# Patient Record
Sex: Male | Born: 1940 | Race: White | Hispanic: No | Marital: Married | Smoking: Former smoker
Health system: Southern US, Community
[De-identification: ages and names within clinical notes are randomized; demographics above are authoritative.]

## PROBLEM LIST (undated history)

## (undated) DIAGNOSIS — M1990 Unspecified osteoarthritis, unspecified site: Secondary | ICD-10-CM

## (undated) DIAGNOSIS — E785 Hyperlipidemia, unspecified: Secondary | ICD-10-CM

## (undated) DIAGNOSIS — N189 Chronic kidney disease, unspecified: Secondary | ICD-10-CM

## (undated) DIAGNOSIS — K219 Gastro-esophageal reflux disease without esophagitis: Secondary | ICD-10-CM

## (undated) DIAGNOSIS — I10 Essential (primary) hypertension: Secondary | ICD-10-CM

## (undated) DIAGNOSIS — K5790 Diverticulosis of intestine, part unspecified, without perforation or abscess without bleeding: Secondary | ICD-10-CM

## (undated) DIAGNOSIS — E119 Type 2 diabetes mellitus without complications: Secondary | ICD-10-CM

## (undated) HISTORY — PX: TONSILLECTOMY: SUR1361

---

## 1981-02-27 HISTORY — PX: KIDNEY STONE SURGERY: SHX686

## 2013-06-06 ENCOUNTER — Ambulatory Visit: Payer: Self-pay | Admitting: Family Medicine

## 2015-03-04 ENCOUNTER — Encounter
Admission: RE | Admit: 2015-03-04 | Discharge: 2015-03-04 | Disposition: A | Discharge status: Home / Self Care | Payer: Medicare Other | Source: Ambulatory Visit | Attending: Surgery | Admitting: Surgery

## 2015-03-04 DIAGNOSIS — Z01810 Encounter for preprocedural cardiovascular examination: Secondary | ICD-10-CM | POA: Insufficient documentation

## 2015-03-04 HISTORY — DX: Chronic kidney disease, unspecified: N18.9

## 2015-03-04 HISTORY — DX: Diverticulosis of intestine, part unspecified, without perforation or abscess without bleeding: K57.90

## 2015-03-04 HISTORY — DX: Hyperlipidemia, unspecified: E78.5

## 2015-03-04 HISTORY — DX: Gastro-esophageal reflux disease without esophagitis: K21.9

## 2015-03-04 HISTORY — DX: Unspecified osteoarthritis, unspecified site: M19.90

## 2015-03-04 NOTE — Patient Instructions (Signed)
  Your procedure is scheduled on:March 11, 2015 (Thursday) Report to Day Surgery.[facility](Medical Mall) Second Floor To find out your arrival time please call [phone] between 1PM - 3PM on March 10, 2015 (Wednesday).  Remember: Instructions that are not followed completely may result in serious medical risk, up to and including death, or upon the discretion of your surgeon and anesthesiologist your surgery may need to be rescheduled.    __x__ 1. Do not eat food or drink liquids after midnight. No gum chewing or hard candies.     __x__ 2. No Alcohol for 24 hours before or after surgery.   ____ 3. Bring all medications with you on the day of surgery if instructed.    __x__ 4. Notify your doctor if there is any change in your medical condition     (cold, fever, infections).     Do not wear jewelry, make-up, hairpins, clips or nail polish.  Do not wear lotions, powders, or perfumes. You may wear deodorant.  Do not shave 48 hours prior to surgery. Men may shave face and neck.  Do not bring valuables to the hospital.    [facility] is not responsible for any belongings or valuables.               Contacts, dentures or bridgework may not be worn into surgery.  Leave your suitcase in the car. After surgery it may be brought to your room.  For patients admitted to the hospital, discharge time is determined by your                treatment team.   Patients discharged the day of surgery will not be allowed to drive home.   Please read over the following fact sheets that you were given:   Surgical Site Infection Prevention   __x__ Take these medicines the morning of surgery with A SIP OF WATER:    1. Omeprazole  2.   3.   4.  5.  6.  ____ Fleet Enema (as directed)   ____ Use CHG Soap as directed  ____ Use inhalers on the day of surgery  ____ Stop metformin 2 days prior to surgery    ____ Take 1/2 of usual insulin dose the night before surgery and none on the morning of surgery.    __x__ Stop Coumadin/Plavix/aspirin on (Patient has stopped Aspirin)  __x__ Stop Anti-inflammatories on (Tylenol ok to take for pain if needed)   __x__ Stop supplements until after surgery.  (Stop Fish Oil and Melatonin now)  ____ Bring C-Pap to the hospital.

## 2015-03-11 ENCOUNTER — Encounter
Admission: RE | Disposition: A | Discharge status: Home / Self Care | Payer: Self-pay | Source: Ambulatory Visit | Attending: Surgery

## 2015-03-11 ENCOUNTER — Encounter: Payer: Self-pay | Admitting: Surgery

## 2015-03-11 ENCOUNTER — Ambulatory Visit
Admission: RE | Admit: 2015-03-11 | Discharge: 2015-03-11 | Disposition: A | Discharge status: Home / Self Care | Payer: Medicare Other | Source: Ambulatory Visit | Attending: Surgery | Admitting: Surgery

## 2015-03-11 ENCOUNTER — Ambulatory Visit: Payer: Medicare Other | Admitting: Anesthesiology

## 2015-03-11 DIAGNOSIS — K429 Umbilical hernia without obstruction or gangrene: Secondary | ICD-10-CM | POA: Diagnosis present

## 2015-03-11 DIAGNOSIS — E785 Hyperlipidemia, unspecified: Secondary | ICD-10-CM | POA: Diagnosis not present

## 2015-03-11 DIAGNOSIS — Z87891 Personal history of nicotine dependence: Secondary | ICD-10-CM | POA: Diagnosis not present

## 2015-03-11 DIAGNOSIS — K219 Gastro-esophageal reflux disease without esophagitis: Secondary | ICD-10-CM | POA: Insufficient documentation

## 2015-03-11 DIAGNOSIS — Z7982 Long term (current) use of aspirin: Secondary | ICD-10-CM | POA: Insufficient documentation

## 2015-03-11 DIAGNOSIS — Z87442 Personal history of urinary calculi: Secondary | ICD-10-CM | POA: Insufficient documentation

## 2015-03-11 DIAGNOSIS — M1990 Unspecified osteoarthritis, unspecified site: Secondary | ICD-10-CM | POA: Insufficient documentation

## 2015-03-11 DIAGNOSIS — Z79899 Other long term (current) drug therapy: Secondary | ICD-10-CM | POA: Insufficient documentation

## 2015-03-11 DIAGNOSIS — K5790 Diverticulosis of intestine, part unspecified, without perforation or abscess without bleeding: Secondary | ICD-10-CM | POA: Insufficient documentation

## 2015-03-11 HISTORY — PX: UMBILICAL HERNIA REPAIR: SHX196

## 2015-03-11 SURGERY — REPAIR, HERNIA, UMBILICAL, ADULT
Anesthesia: Choice | Wound class: Clean

## 2015-03-11 MED ORDER — FENTANYL CITRATE (PF) 100 MCG/2ML IJ SOLN
INTRAMUSCULAR | Status: DC | PRN
  Administered 2015-03-11: 50 ug via INTRAVENOUS
  Administered 2015-03-11 (×2): 25 ug via INTRAVENOUS

## 2015-03-11 MED ORDER — FENTANYL CITRATE (PF) 100 MCG/2ML IJ SOLN
INTRAMUSCULAR | Status: AC
  Administered 2015-03-11: 25 ug via INTRAVENOUS
  Filled 2015-03-11: qty 2

## 2015-03-11 MED ORDER — HYDROCODONE-ACETAMINOPHEN 5-325 MG PO TABS: 1.0000 | ORAL_TABLET | ORAL | Status: AC | PRN

## 2015-03-11 MED ORDER — LIDOCAINE HCL (CARDIAC) 20 MG/ML IV SOLN
INTRAVENOUS | Status: DC | PRN
  Administered 2015-03-11: 40 mg via INTRAVENOUS

## 2015-03-11 MED ORDER — ONDANSETRON HCL 4 MG/2ML IJ SOLN: 4.0000 mg | Freq: Once | INTRAMUSCULAR | Status: DC | PRN

## 2015-03-11 MED ORDER — CEFAZOLIN SODIUM-DEXTROSE 2-3 GM-% IV SOLR
INTRAVENOUS | Status: AC
  Filled 2015-03-11: qty 50

## 2015-03-11 MED ORDER — PROPOFOL 10 MG/ML IV BOLUS
INTRAVENOUS | Status: DC | PRN
  Administered 2015-03-11: 160 mg via INTRAVENOUS

## 2015-03-11 MED ORDER — BUPIVACAINE-EPINEPHRINE (PF) 0.5% -1:200000 IJ SOLN
INTRAMUSCULAR | Status: DC | PRN
  Administered 2015-03-11: 5 mL

## 2015-03-11 MED ORDER — ONDANSETRON HCL 4 MG/2ML IJ SOLN
INTRAMUSCULAR | Status: DC | PRN
  Administered 2015-03-11: 4 mg via INTRAVENOUS

## 2015-03-11 MED ORDER — DEXAMETHASONE SODIUM PHOSPHATE 10 MG/ML IJ SOLN
INTRAMUSCULAR | Status: DC | PRN
  Administered 2015-03-11: 10 mg via INTRAVENOUS

## 2015-03-11 MED ORDER — HYDROCODONE-ACETAMINOPHEN 5-325 MG PO TABS: 1.0000 | ORAL_TABLET | ORAL | Status: DC | PRN

## 2015-03-11 MED ORDER — BUPIVACAINE-EPINEPHRINE (PF) 0.5% -1:200000 IJ SOLN
INTRAMUSCULAR | Status: AC
  Filled 2015-03-11: qty 30

## 2015-03-11 MED ORDER — FENTANYL CITRATE (PF) 100 MCG/2ML IJ SOLN
25.0000 ug | INTRAMUSCULAR | Status: DC | PRN
  Administered 2015-03-11 (×2): 25 ug via INTRAVENOUS

## 2015-03-11 MED ORDER — CEFAZOLIN SODIUM-DEXTROSE 2-3 GM-% IV SOLR
2.0000 g | Freq: Once | INTRAVENOUS | Status: AC
  Administered 2015-03-11: 2 g via INTRAVENOUS

## 2015-03-11 MED ORDER — LACTATED RINGERS IV SOLN
INTRAVENOUS | Status: DC
  Administered 2015-03-11 (×2): via INTRAVENOUS

## 2015-03-11 MED ORDER — MIDAZOLAM HCL 2 MG/2ML IJ SOLN
INTRAMUSCULAR | Status: DC | PRN
  Administered 2015-03-11: 2 mg via INTRAVENOUS

## 2015-03-11 SURGICAL SUPPLY — 23 items
BLADE SURG 15 STRL LF DISP TIS (BLADE) ×1 IMPLANT
BLADE SURG 15 STRL SS (BLADE) ×2
CANISTER SUCT 1200ML W/VALVE (MISCELLANEOUS) ×3 IMPLANT
CHLORAPREP W/TINT 26ML (MISCELLANEOUS) ×3 IMPLANT
DRAPE LAPAROTOMY 77X122 PED (DRAPES) ×3 IMPLANT
GLOVE BIO SURGEON STRL SZ7.5 (GLOVE) ×3 IMPLANT
GOWN STRL REUS W/ TWL LRG LVL3 (GOWN DISPOSABLE) ×3 IMPLANT
GOWN STRL REUS W/TWL LRG LVL3 (GOWN DISPOSABLE) ×6
KIT RM TURNOVER STRD PROC AR (KITS) ×3 IMPLANT
LABEL OR SOLS (LABEL) ×3 IMPLANT
LIQUID BAND (GAUZE/BANDAGES/DRESSINGS) ×3 IMPLANT
MESH SYNTHETIC 4X6 SOFT BARD (Mesh General) ×1 IMPLANT
MESH SYNTHETIC SOFT BARD 4X6 (Mesh General) ×2 IMPLANT
NEEDLE HYPO 25X1 1.5 SAFETY (NEEDLE) ×3 IMPLANT
NS IRRIG 500ML POUR BTL (IV SOLUTION) ×3 IMPLANT
PACK BASIN MINOR ARMC (MISCELLANEOUS) ×3 IMPLANT
PAD GROUND ADULT SPLIT (MISCELLANEOUS) ×3 IMPLANT
SUT CHROMIC 3 0 SH 27 (SUTURE) ×3 IMPLANT
SUT CHROMIC 4 0 RB 1X27 (SUTURE) ×3 IMPLANT
SUT MNCRL+ 5-0 UNDYED PC-3 (SUTURE) ×1 IMPLANT
SUT MONOCRYL 5-0 (SUTURE) ×2
SUT SURGILON 0 30 BLK (SUTURE) ×6 IMPLANT
SYRINGE 10CC LL (SYRINGE) ×3 IMPLANT

## 2015-03-11 NOTE — Anesthesia Postprocedure Evaluation (Signed)
Anesthesia Post Note  Patient: James Yang  Procedure(s) Performed: Procedure(s) (LRB): HERNIA REPAIR UMBILICAL ADULT (N/A)  Patient location during evaluation: PACU Anesthesia Type: General Level of consciousness: awake and alert Pain management: pain level controlled Vital Signs Assessment: post-procedure vital signs reviewed and stable Respiratory status: spontaneous breathing and respiratory function stable Cardiovascular status: stable Anesthetic complications: no    Last Vitals:  Filed Vitals:   03/11/15 0935 03/11/15 0944  BP: 132/81 128/64  Pulse: 68 70  Temp: 36.7 C 36.2 C  Resp: 18 16    Last Pain:  Filed Vitals:   03/11/15 0947  PainSc: 1                  [name],James Yang

## 2015-03-11 NOTE — Discharge Instructions (Addendum)
Take Tylenol or Norco if needed for pain.  Resume aspirin on Saturday.  May shower.Avoid straining and heavy lifting.  General Anesthesia, Adult, Care After Refer to this sheet in the next few weeks. These instructions provide you with information on caring for yourself after your procedure. Your health care provider may also give you more specific instructions. Your treatment has been planned according to current medical practices, but problems sometimes occur. Call your health care provider if you have any problems or questions after your procedure. WHAT TO EXPECT AFTER THE PROCEDURE After the procedure, it is typical to experience:  Sleepiness.  Nausea and vomiting. HOME CARE INSTRUCTIONS  For the first 24 hours after general anesthesia:  Have a responsible person with you.  Do not drive a car. If you are alone, do not take public transportation.  Do not drink alcohol.  Do not take medicine that has not been prescribed by your health care provider.  Do not sign important papers or make important decisions.  You may resume a normal diet and activities as directed by your health care provider.  Change bandages (dressings) as directed.  If you have questions or problems that seem related to general anesthesia, call the hospital and ask for the anesthetist or anesthesiologist on call. SEEK MEDICAL CARE IF:  You have nausea and vomiting that continue the day after anesthesia.  You develop a rash. SEEK IMMEDIATE MEDICAL CARE IF:   You have difficulty breathing.  You have chest pain.  You have any allergic problems.   This information is not intended to replace advice given to you by your health care provider. Make sure you discuss any questions you have with your health care provider.   Document Released: 05/22/2000 Document Revised: 03/06/2014 Document Reviewed: 06/14/2011 Elsevier Interactive Patient Education [employer]2016 Elsevier Inc.

## 2015-03-11 NOTE — H&P (Signed)
James Yang is an 75 y.o. male.   Chief Complaint: Stinging discomfort at the navel HPI: He reports recent enlargement of a bulge at the navel with stinging discomfort.  Past Medical History  Diagnosis Date  . Chronic kidney disease     Kidney stones  . GERD (gastroesophageal reflux disease)   . Arthritis   . Diverticulosis   . Hyperlipidemia     Past Surgical History  Procedure Laterality Date  . Tonsillectomy    . Kidney stone surgery Right 1983    Dr. Leonette MonarchHarman    No family history on file. Social History:  reports that he quit smoking about 12 years ago. His smoking use included Cigarettes. He smoked 1.00 pack per day. He has never used smokeless tobacco. He reports that he does not use illicit drugs. His alcohol history is not on file.  Allergies: No Known Allergies  Medications Prior to Admission  Medication Sig Dispense Refill  . aspirin (ASPIRIN ADULT LOW DOSE) 81 MG EC tablet Take 81 mg by mouth daily. Swallow whole.    Marland Kitchen. atorvastatin (LIPITOR) 20 MG tablet Take 20 mg by mouth at bedtime.    . DiphenhydrAMINE HCl, Sleep, 50 MG CAPS Take 50 mg by mouth at bedtime.    . Melatonin 5 MG CAPS Take 5 mg by mouth at bedtime.    . Omega-3 Fatty Acids (FISH OIL) 1200 MG CAPS Take 1 capsule by mouth daily.    Marland Kitchen. omeprazole (PRILOSEC) 20 MG capsule Take 20 mg by mouth at bedtime.      No results found for this or any previous visit (from the past 48 hour(s)). No results found.  ROS he reports no other change in overall condition since the day of the office visit. No difficulties breathing no recent cough cold or sore throat.  Blood pressure 156/86, pulse 79, temperature 97.4 F (36.3 C), temperature source Oral, resp. rate 14, SpO2 95 %. Physical Exam he is awake alert and oriented and in no acute distress  HEENT:  Head is normocephalic.  Pupils are equal reactive to light.  Extraocular movements are intact. Sclera is clear.  Pharynx is clear.  LUNGS:  Clear without rales  rhonchi or wheezes.  HEART:  Regular rhythm S1-S2, without murmur.  Abdomen is obese and soft. There is a 3 cm bulge at the navel which is partially reducible and minimally tender  Assessment/Plan Symptomatic umbilical hernia. Plan is for umbilical hernia repair  James Yang 03/11/2015, 7:24 AM

## 2015-03-11 NOTE — Transfer of Care (Signed)
Immediate Anesthesia Transfer of Care Note  Patient: James Yang  Procedure(s) Performed: Procedure(s): HERNIA REPAIR UMBILICAL ADULT (N/A)  Patient Location: PACU  Anesthesia Type:General  Level of Consciousness: awake  Airway & Oxygen Therapy: Patient Spontanous Breathing and Patient connected to face mask oxygen  Post-op Assessment: Report given to RN and Post -op Vital signs reviewed and stable  Post vital signs: Reviewed and stable  Last Vitals:  Filed Vitals:   03/11/15 0613  BP: 156/86  Pulse: 79  Temp: 36.3 C  Resp: 14    Complications: No apparent anesthesia complications

## 2015-03-11 NOTE — Progress Notes (Signed)
Pneumonia vaccine received 6 months ago

## 2015-03-11 NOTE — Anesthesia Preprocedure Evaluation (Signed)
Anesthesia Evaluation  Patient identified by MRN, date of birth, ID band Patient awake    Reviewed: Allergy & Precautions, NPO status , Patient's Chart, lab work & pertinent test results  History of Anesthesia Complications Negative for: history of anesthetic complications  Airway Mallampati: III       Dental  (+) Teeth Intact   Pulmonary neg pulmonary ROS, former smoker,           Cardiovascular      Neuro/Psych Seizures - (febrile, as a child),  negative neurological ROS     GI/Hepatic Neg liver ROS, GERD  Medicated and Controlled,  Endo/Other  negative endocrine ROS  Renal/GU Renal disease (partial R nephrectomy, stones)     Musculoskeletal   Abdominal   Peds  Hematology negative hematology ROS (+)   Anesthesia Other Findings   Reproductive/Obstetrics                             Anesthesia Physical Anesthesia Plan  ASA: II  Anesthesia Plan:    Post-op Pain Management:    Induction: Intravenous  Airway Management Planned: Oral ETT  Additional Equipment:   Intra-op Plan:   Post-operative Plan:   Informed Consent: I have reviewed the patients History and Physical, chart, labs and discussed the procedure including the risks, benefits and alternatives for the proposed anesthesia with the patient or authorized representative who has indicated his/her understanding and acceptance.     Plan Discussed with:   Anesthesia Plan Comments:         Anesthesia Quick Evaluation

## 2015-03-11 NOTE — Op Note (Signed)
OPERATIVE REPORT  PREOPERATIVE  DIAGNOSIS: . Umbilical hernia  POSTOPERATIVE DIAGNOSIS: . Umbilical hernia  PROCEDURE: . Umbilical hernia repair  ANESTHESIA:  General  SURGEON: Renda RollsWilton Smith  MD   INDICATIONS: . He reports a history of gradual enlargement of an umbilical hernia and also recent stinging discomfort. An umbilical hernia was demonstrated on physical exam and repair was recommended for definitive treatment.  With the patient on the operating table in the supine position the abdomen was prepared with ChloraPrep and draped in a sterile manner. A transversely oriented supraumbilical curvilinear incision was made. An umbilical hernia sac was dissected free from surrounding tissues and dissected away from a fascial ring defect and was inverted. The fascial ring defect was approximately 1 cm in dimension. Properitoneal fat was dissected away from the fascial ring defect circumferentially. Bard soft mesh was cut to create a circular shape of some 1.8 cm in diameter and was placed into the properitoneal plane and sutured to the overlying fascia with 0 Surgilon through and through sutures. The fascial defect was closed with a transversely oriented suture line of interrupted 0 Surgilon sutures incorporating each suture into the mesh. Deep fascia and subcutaneous tissues were infiltrated with half percent Sensorcaine with epinephrine. The skin of the umbilicus was sutured to the deep fascia with 3-0 chromic. The skin incision was closed with running 5-0 Monocryl and LiquiBand. The patient tolerated surgery satisfactorily and was prepared for transfer to the recovery room  [facility] LLC[facility]ilton Smith M.D.

## 2015-03-12 ENCOUNTER — Encounter: Payer: Self-pay | Admitting: Surgery

## 2018-10-01 ENCOUNTER — Other Ambulatory Visit: Payer: Self-pay | Admitting: Student

## 2018-10-01 DIAGNOSIS — K219 Gastro-esophageal reflux disease without esophagitis: Secondary | ICD-10-CM

## 2018-10-01 DIAGNOSIS — R1319 Other dysphagia: Secondary | ICD-10-CM

## 2018-10-01 DIAGNOSIS — R1310 Dysphagia, unspecified: Secondary | ICD-10-CM

## 2018-10-10 ENCOUNTER — Ambulatory Visit
Admission: RE | Admit: 2018-10-10 | Discharge: 2018-10-10 | Disposition: A | Discharge status: Home / Self Care | Payer: Medicare Other | Source: Ambulatory Visit | Attending: Student | Admitting: Student

## 2018-10-10 ENCOUNTER — Other Ambulatory Visit: Payer: Self-pay

## 2018-10-10 DIAGNOSIS — R1310 Dysphagia, unspecified: Secondary | ICD-10-CM | POA: Diagnosis not present

## 2018-10-10 DIAGNOSIS — R1319 Other dysphagia: Secondary | ICD-10-CM

## 2018-10-10 DIAGNOSIS — K219 Gastro-esophageal reflux disease without esophagitis: Secondary | ICD-10-CM | POA: Diagnosis present

## 2018-10-15 ENCOUNTER — Telehealth: Payer: Self-pay | Admitting: Gastroenterology

## 2018-10-15 NOTE — Telephone Encounter (Signed)
Received a referral to schedule Esophageal Manometry. Pt had an abnormal Barium swallow. Please contact patient to schedule at [phone].

## 2018-10-18 ENCOUNTER — Other Ambulatory Visit: Payer: Self-pay

## 2018-10-18 DIAGNOSIS — Z1159 Encounter for screening for other viral diseases: Secondary | ICD-10-CM

## 2018-10-18 DIAGNOSIS — R1310 Dysphagia, unspecified: Secondary | ICD-10-CM

## 2018-10-18 DIAGNOSIS — R1319 Other dysphagia: Secondary | ICD-10-CM

## 2018-10-18 NOTE — Telephone Encounter (Signed)
Okay to schedule esophageal manometry as direct procedure at [facility] endoscopy

## 2018-11-02 ENCOUNTER — Other Ambulatory Visit (HOSPITAL_COMMUNITY): Payer: Medicare Other

## 2018-11-02 ENCOUNTER — Other Ambulatory Visit (HOSPITAL_COMMUNITY)
Admission: RE | Admit: 2018-11-02 | Discharge: 2018-11-02 | Disposition: A | Discharge status: Home / Self Care | Payer: Medicare Other | Source: Ambulatory Visit | Attending: Gastroenterology | Admitting: Gastroenterology

## 2018-11-02 DIAGNOSIS — R1310 Dysphagia, unspecified: Secondary | ICD-10-CM | POA: Diagnosis not present

## 2018-11-02 DIAGNOSIS — Z01812 Encounter for preprocedural laboratory examination: Secondary | ICD-10-CM | POA: Diagnosis present

## 2018-11-02 DIAGNOSIS — Z20828 Contact with and (suspected) exposure to other viral communicable diseases: Secondary | ICD-10-CM | POA: Diagnosis not present

## 2018-11-03 LAB — NOVEL CORONAVIRUS, NAA (HOSP ORDER, SEND-OUT TO REF LAB; TAT 18-24 HRS): SARS-CoV-2, NAA: NOT DETECTED

## 2018-11-06 ENCOUNTER — Encounter (HOSPITAL_COMMUNITY)
Admission: RE | Disposition: A | Discharge status: Home / Self Care | Payer: Self-pay | Source: Home / Self Care | Attending: Gastroenterology

## 2018-11-06 ENCOUNTER — Ambulatory Visit (HOSPITAL_COMMUNITY)
Admission: RE | Admit: 2018-11-06 | Discharge: 2018-11-06 | Disposition: A | Discharge status: Home / Self Care | Payer: Medicare Other | Attending: Gastroenterology | Admitting: Gastroenterology

## 2018-11-06 DIAGNOSIS — R1319 Other dysphagia: Secondary | ICD-10-CM

## 2018-11-06 DIAGNOSIS — R1310 Dysphagia, unspecified: Secondary | ICD-10-CM

## 2018-11-06 HISTORY — PX: ESOPHAGEAL MANOMETRY: SHX5429

## 2018-11-06 SURGERY — MANOMETRY, ESOPHAGUS

## 2018-11-06 MED ORDER — LIDOCAINE VISCOUS HCL 2 % MT SOLN
OROMUCOSAL | Status: AC
  Filled 2018-11-06: qty 15

## 2018-11-06 SURGICAL SUPPLY — 2 items
FACESHIELD LNG OPTICON STERILE (SAFETY) IMPLANT
GLOVE BIO SURGEON STRL SZ8 (GLOVE) ×6 IMPLANT

## 2018-11-06 NOTE — Progress Notes (Signed)
Esophageal Manometry done per protocol. Pt tolerated well without distress or complications.

## 2018-11-07 ENCOUNTER — Encounter (HOSPITAL_COMMUNITY): Payer: Self-pay | Admitting: Gastroenterology

## 2018-11-25 DIAGNOSIS — R1310 Dysphagia, unspecified: Secondary | ICD-10-CM

## 2018-11-25 DIAGNOSIS — R1319 Other dysphagia: Secondary | ICD-10-CM

## 2018-11-29 NOTE — Telephone Encounter (Signed)
Results faxed to primary GI at the [facility].

## 2018-12-20 ENCOUNTER — Other Ambulatory Visit: Payer: Self-pay | Admitting: Student

## 2018-12-20 ENCOUNTER — Other Ambulatory Visit (HOSPITAL_COMMUNITY): Payer: Self-pay | Admitting: Student

## 2018-12-20 DIAGNOSIS — R1319 Other dysphagia: Secondary | ICD-10-CM

## 2018-12-20 DIAGNOSIS — R1310 Dysphagia, unspecified: Secondary | ICD-10-CM

## 2018-12-31 ENCOUNTER — Ambulatory Visit
Admission: RE | Admit: 2018-12-31 | Discharge: 2018-12-31 | Disposition: A | Discharge status: Home / Self Care | Payer: Medicare Other | Source: Ambulatory Visit | Attending: Student | Admitting: Student

## 2018-12-31 ENCOUNTER — Other Ambulatory Visit: Payer: Self-pay

## 2018-12-31 DIAGNOSIS — I700 Atherosclerosis of aorta: Secondary | ICD-10-CM | POA: Insufficient documentation

## 2018-12-31 DIAGNOSIS — J439 Emphysema, unspecified: Secondary | ICD-10-CM | POA: Insufficient documentation

## 2018-12-31 DIAGNOSIS — R1319 Other dysphagia: Secondary | ICD-10-CM

## 2018-12-31 DIAGNOSIS — R1310 Dysphagia, unspecified: Secondary | ICD-10-CM | POA: Insufficient documentation

## 2018-12-31 DIAGNOSIS — K760 Fatty (change of) liver, not elsewhere classified: Secondary | ICD-10-CM | POA: Diagnosis not present

## 2018-12-31 DIAGNOSIS — I2510 Atherosclerotic heart disease of native coronary artery without angina pectoris: Secondary | ICD-10-CM | POA: Insufficient documentation

## 2018-12-31 LAB — POCT I-STAT CREATININE: Creatinine, Ser: 1.3 mg/dL — ABNORMAL HIGH (ref 0.61–1.24)

## 2018-12-31 MED ORDER — IOHEXOL 300 MG/ML  SOLN
75.0000 mL | Freq: Once | INTRAMUSCULAR | Status: AC | PRN
  Administered 2018-12-31: 75 mL via INTRAVENOUS

## 2019-11-04 ENCOUNTER — Ambulatory Visit: Payer: Self-pay | Attending: Internal Medicine

## 2019-11-04 DIAGNOSIS — Z23 Encounter for immunization: Secondary | ICD-10-CM

## 2019-11-04 NOTE — Progress Notes (Signed)
   [id number]  Name:  James Yang.    MRN: [id number] DOB: September 18, 1940  11/04/2019  James Yang was observed post Covid-19 immunization for 15 minutes without incident. He was provided with Vaccine Information Sheet and instruction to access the V-Safe system.   James Yang was instructed to call [phone] with any severe reactions post vaccine: Marland Kitchen Difficulty breathing  . Swelling of face and throat  . A fast heartbeat  . A bad rash all over body  . Dizziness and weakness

## 2020-04-03 IMAGING — CT CT CHEST W/ CM
2 of 4 series · 15 of 36 positions shown, 18 images · IV contrast (omnipaque)
Comparison: Barium swallow dated 10/10/2018.

CLINICAL DATA: Abnormal esophageal studies. Worsening dysphagia.

EXAM:
CT CHEST WITH CONTRAST
TECHNIQUE: Multidetector CT imaging of the chest was performed during
intravenous contrast administration.
CONTRAST:  75mL OMNIPAQUE IOHEXOL 300 MG/ML  SOLN

[Series 2: axial chest 2.00 · axial · 0.77mm/px · z∈[-1209,-919]mm · 12 of 173 slices shown, 15 images]
[im 14/173  mediastinal]
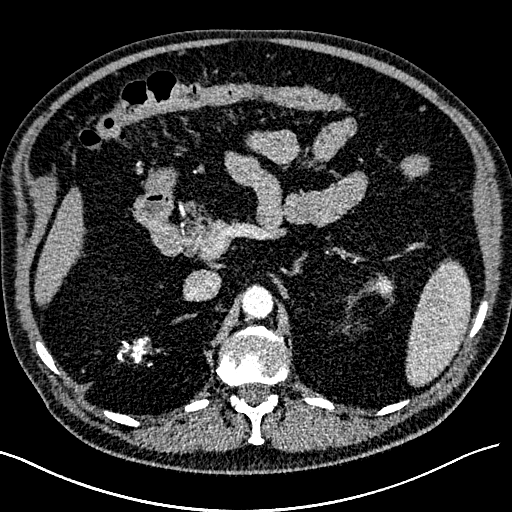
[im 14/173  lung]
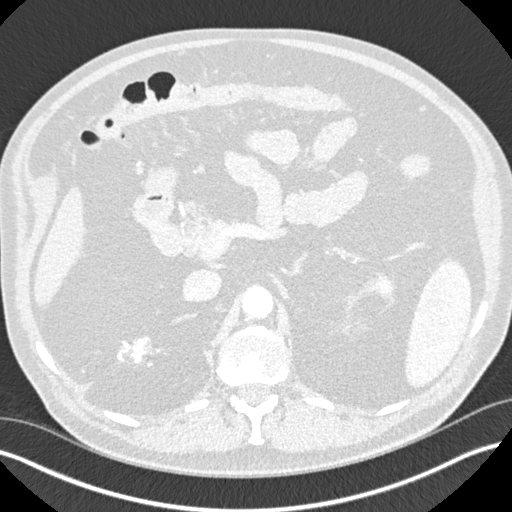
[im 27/173  lung]
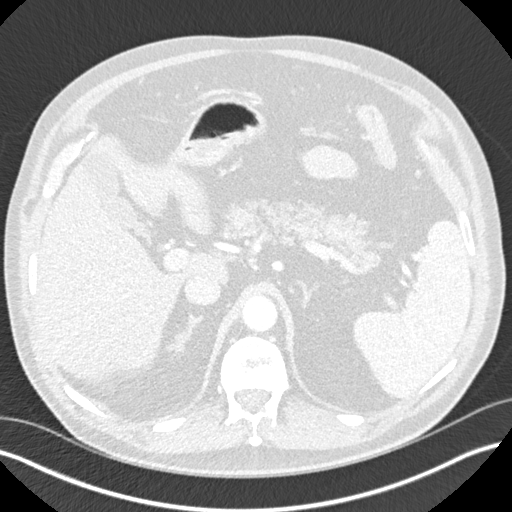
[im 40/173  lung]
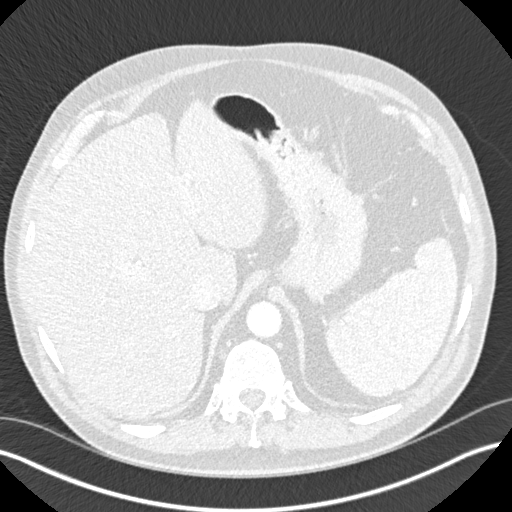
[im 53/173  lung]
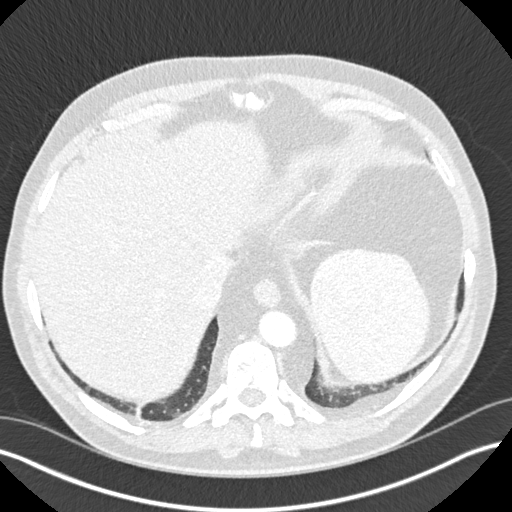
[im 67/173  mediastinal]
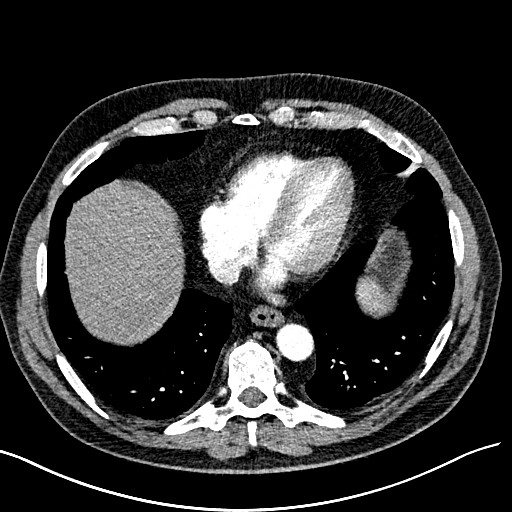
[im 67/173  lung]
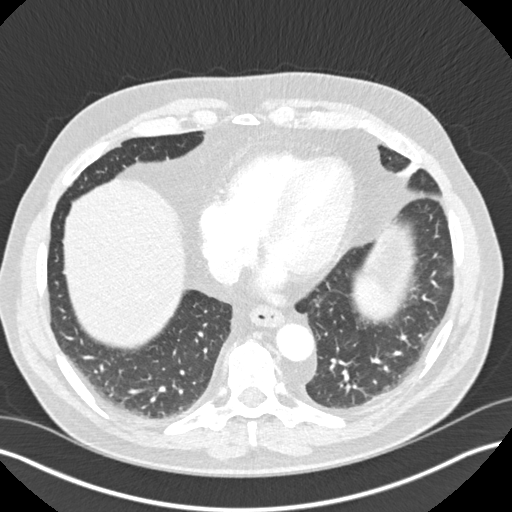
[im 80/173  lung]
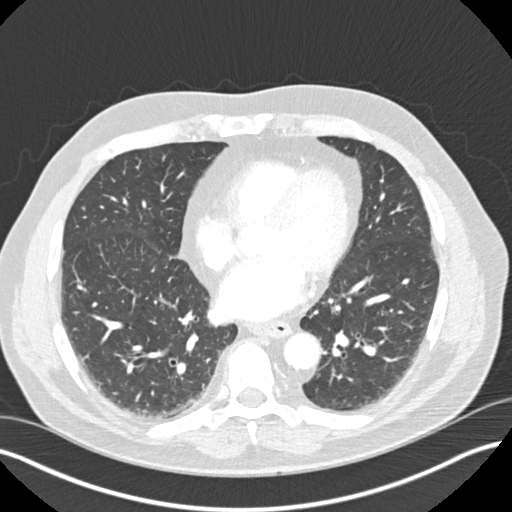
[im 93/173  lung]
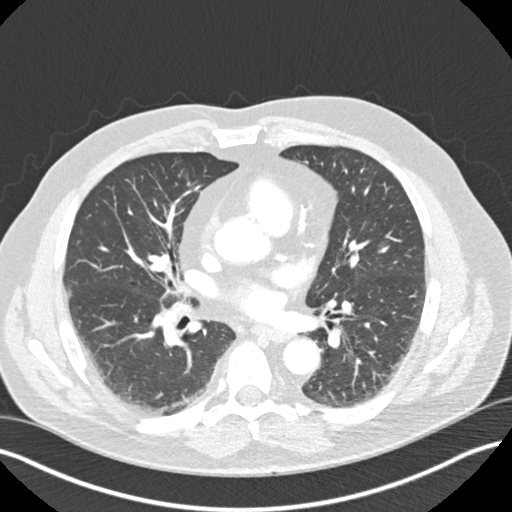
[im 106/173  lung]
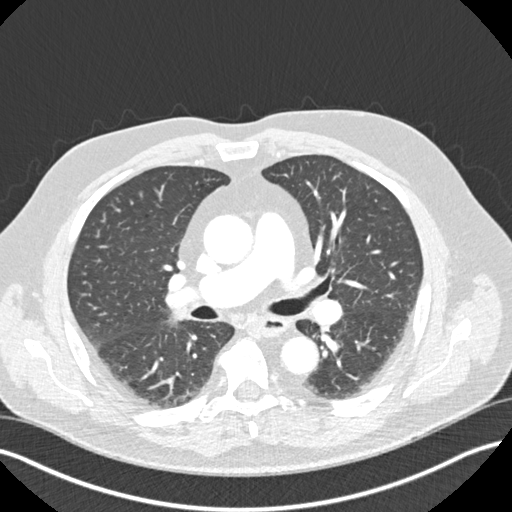
[im 120/173  mediastinal]
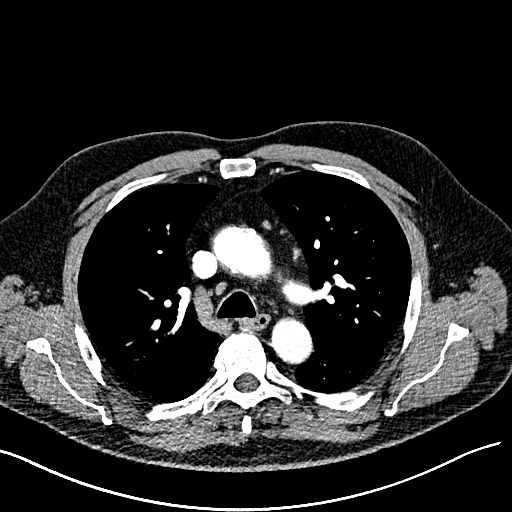
[im 120/173  lung]
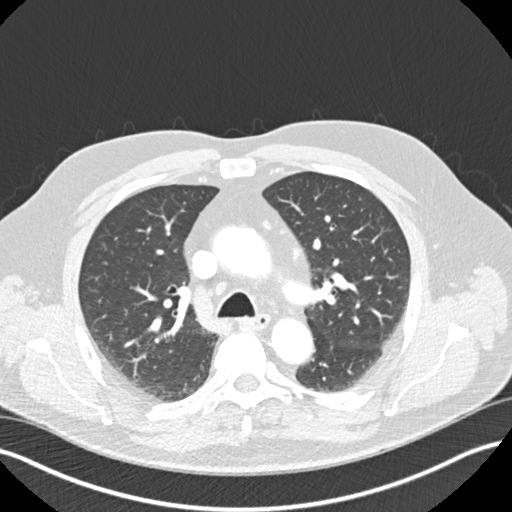
[im 133/173  lung]
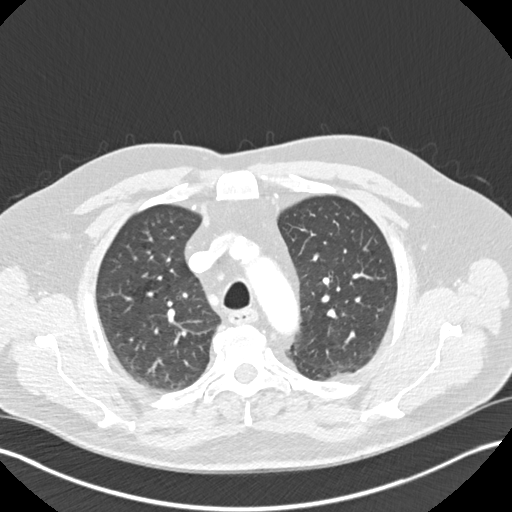
[im 146/173  lung]
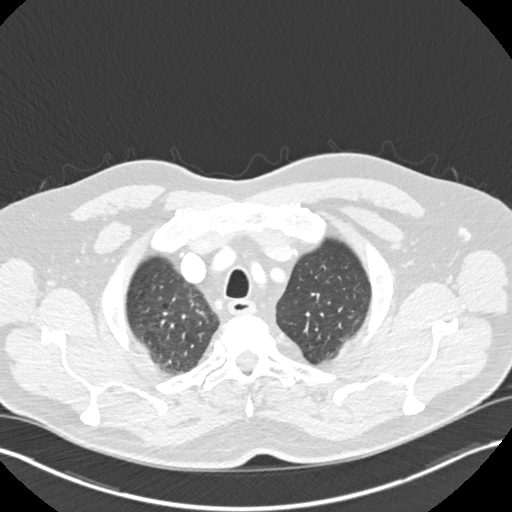
[im 159/173  lung]
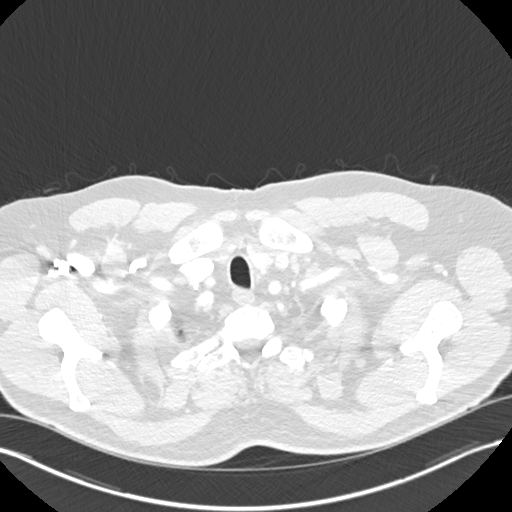

[Series 4: coronal chest 2.00 cor · coronal · 0.68mm/px · 3 of 174 slices shown]
[im 35/174  lung]
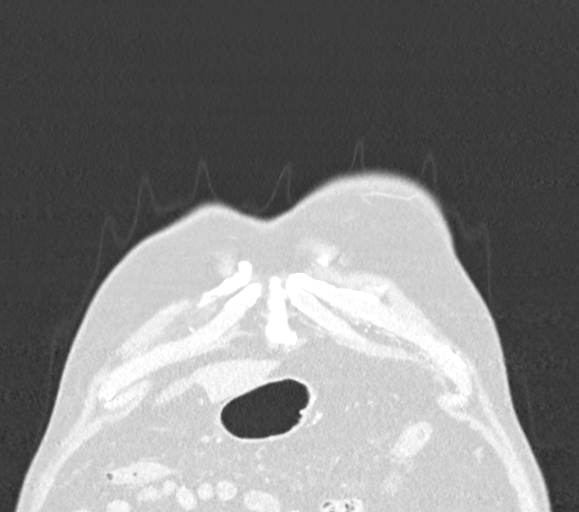
[im 70/174  lung]
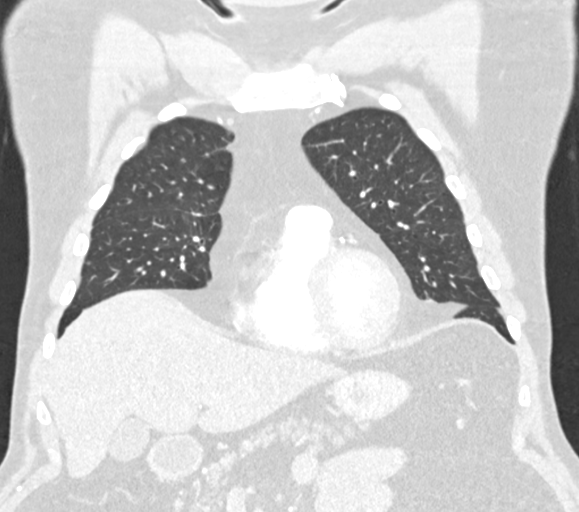
[im 104/174  lung]
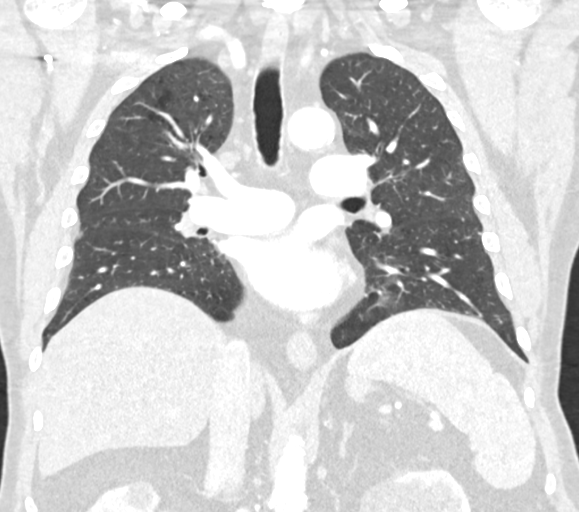

[15 of 36 positions shown; findings below may reference images not displayed]

FINDINGS: Cardiovascular: Aortic atherosclerosis. No thoracic aortic aneurysm
or evidence of aortic dissection. No variant anatomy. No pericardial
effusion. Diffuse coronary artery calcifications.

Mediastinum/Nodes: No mass or enlarged lymph nodes within the
mediastinum or perihilar regions. Esophagus appears normal. No
paraesophageal mass. Trachea appears normal.

Lungs/Pleura: Lungs are clear. Emphysematous changes within the
upper lobes, mild to moderate in degree.

Upper Abdomen: Liver is low in density indicating fatty
infiltration. Large simple appearing cyst exophytic to the upper
pole of the LEFT kidney, incompletely imaged. Partially calcified
mass exophytic to the upper pole of the RIGHT kidney,
benign-appearing and presumably chronic post traumatic/post
infectious change.

Musculoskeletal: No acute or suspicious osseous finding.
IMPRESSION: 1. No acute findings. Esophagus is unremarkable. No mass or enlarged
lymph nodes within the mediastinum. No paraesophageal mass.
2. Aortic atherosclerosis.
3. Diffuse coronary artery calcifications.
4. Fatty infiltration of the liver.
5. Additional chronic/incidental findings detailed above.

Aortic Atherosclerosis (XOK93-2H4.4) and Emphysema ([id number].V).

## 2021-06-23 ENCOUNTER — Other Ambulatory Visit: Payer: Self-pay | Admitting: Family Medicine

## 2021-06-23 DIAGNOSIS — R1011 Right upper quadrant pain: Secondary | ICD-10-CM

## 2021-06-23 DIAGNOSIS — N2889 Other specified disorders of kidney and ureter: Secondary | ICD-10-CM

## 2021-07-11 ENCOUNTER — Other Ambulatory Visit: Payer: Self-pay

## 2021-07-11 ENCOUNTER — Ambulatory Visit
Admission: RE | Admit: 2021-07-11 | Discharge: 2021-07-11 | Disposition: A | Discharge status: Home / Self Care | Payer: Medicare Other | Source: Ambulatory Visit | Attending: Family Medicine | Admitting: Family Medicine

## 2021-07-11 DIAGNOSIS — R1011 Right upper quadrant pain: Secondary | ICD-10-CM | POA: Insufficient documentation

## 2021-07-11 DIAGNOSIS — N2889 Other specified disorders of kidney and ureter: Secondary | ICD-10-CM | POA: Insufficient documentation

## 2021-07-27 ENCOUNTER — Other Ambulatory Visit: Payer: Self-pay | Admitting: Family Medicine

## 2021-07-27 DIAGNOSIS — N2889 Other specified disorders of kidney and ureter: Secondary | ICD-10-CM

## 2021-08-08 ENCOUNTER — Ambulatory Visit
Admission: RE | Admit: 2021-08-08 | Discharge: 2021-08-08 | Disposition: A | Discharge status: Home / Self Care | Payer: Medicare Other | Source: Ambulatory Visit | Attending: Family Medicine | Admitting: Family Medicine

## 2021-08-08 DIAGNOSIS — N2889 Other specified disorders of kidney and ureter: Secondary | ICD-10-CM | POA: Insufficient documentation

## 2021-08-08 MED ORDER — GADOBUTROL 1 MMOL/ML IV SOLN
10.0000 mL | Freq: Once | INTRAVENOUS | Status: AC | PRN
  Administered 2021-08-08: 10 mL via INTRAVENOUS

## 2021-08-15 ENCOUNTER — Other Ambulatory Visit (HOSPITAL_COMMUNITY): Payer: Self-pay | Admitting: Urology

## 2021-08-15 ENCOUNTER — Ambulatory Visit (HOSPITAL_COMMUNITY)
Admission: RE | Admit: 2021-08-15 | Discharge: 2021-08-15 | Disposition: A | Discharge status: Home / Self Care | Payer: Medicare Other | Source: Ambulatory Visit | Attending: Urology | Admitting: Urology

## 2021-08-15 ENCOUNTER — Other Ambulatory Visit: Payer: Self-pay | Admitting: Urology

## 2021-08-15 DIAGNOSIS — N281 Cyst of kidney, acquired: Secondary | ICD-10-CM | POA: Insufficient documentation

## 2021-08-17 ENCOUNTER — Other Ambulatory Visit: Payer: Self-pay | Admitting: Urology

## 2021-08-23 ENCOUNTER — Other Ambulatory Visit (HOSPITAL_COMMUNITY): Payer: Self-pay

## 2021-08-23 NOTE — Progress Notes (Addendum)
COVID Vaccine received:  []  No [x]  Yes  x3  Date of any COVID positive Test in last 90 days:  PCP - , MD Cardiologist -   Chest x-ray - 08-17-21  Epic EKG -  > 2 years Stress Test - > 10 years ago, done in [place], 08-19-21 ECHO - n/a Cardiac Cath - n/a  Pacemaker/ICD device last checked: Date:       [x]  N/A Spinal Cord Stimulator:[x]  No []  Yes   Other Implants:   Bowel Prep - Clear liquids the day prior to surgery. Miralax-  Dissolve 17 grams in 4 ounces of water AND DRINK AT NOON THE DAY BEFORE SURGERY      History of Sleep Apnea? [x]  No []  Yes  []  unknown Sleep Study Date:   CPAP used?- [x]  No []  Yes  (Instruct to bring their mask & Tubing)  Does the patient monitor blood sugar? []  No [x]  Yes  []  N/A Does patient have a [place] or Dexacom? [x]  No []  Yes   Fasting Blood Sugar Ranges- 120-130 Checks Blood Sugar __1__ times a day  Blood Thinner Instructions:  none Aspirin Instructions: Last Dose:  Activity level:  Can go up a flight of stairs and perform activities of daily living without stopping and without symptoms of chest pain or shortness of breath.[]  No [x]  Yes  []  N/A  Able to do exercises without symptoms []  No [x]  Yes  []  N/A  Patient unable to complete ADLs without assistance [x]  No []  Yes  []  N/A    Comments:   Anesthesia review:   Patient denies shortness of breath, fever, cough and chest pain at PAT appointment  Patient verbalized understanding and agreement to the Pre-Surgical Instructions that were given to them at this PAT appointment. Patient was also educated of the need to review these PAT instructions again prior to his/her surgery.I reviewed the appropriate phone numbers to call if they have any and questions or concerns.

## 2021-08-23 NOTE — Patient Instructions (Addendum)
DUE TO SPACE LIMITATIONS, ONLY TWO VISITORS  (aged 79 and older) ARE ALLOWED TO COME WITH YOU AND STAY IN THE WAITING ROOM DURING YOUR PRE OP AND PROCEDURE.   **NO VISITORS ARE ALLOWED IN THE SHORT STAY AREA OR RECOVERY ROOM!!**  IF YOU WILL BE ADMITTED INTO THE HOSPITAL YOU ARE ALLOWED ONLY FOUR SUPPORT PEOPLE DURING VISITATION HOURS (7 AM -8PM)   The support person(s) must pass our screening, and use Hand sanitizing gel. Visitors GUEST BADGE MUST BE WORN VISIBLY  One adult visitor may remain with you overnight and MUST be in the room by 8 P.M.   You are not required to quarantine at this time prior to your surgery. However, you must do this: Hand Hygiene often Do NOT share personal items Notify your provider if you are in close contact with someone who has COVID or you develop fever 100.4 or greater, new onset of sneezing, cough, sore throat, shortness of breath or body aches.       Your procedure is scheduled on:  Friday  September 02, 2021  Report to [facility] Main Entrance.  Report to admitting at:  05:15 AM  +++++Call this number if you have any questions or problems the morning of surgery [phone]  Do not eat food the day prior to your surgery, ONLY HAVE CLEAR LIQUID DIET.  After Midnight you will still be on a clear liquid diet until  04:30 AM DAY OF SURGERY  Clear Liquid Diet Water Black Coffee (sugar ok, NO MILK/CREAM OR CREAMERS)  Tea (sugar ok, NO MILK/CREAM OR CREAMERS) regular and decaf                             Plain Jell-O (NO RED)                                           Fruit ices (not with fruit pulp, NO RED)                                     Popsicles (NO RED)                                                                  Juice: apple, WHITE grape, WHITE cranberry Sports drinks like Gatorade (NO RED) Clear broth(vegetable,chicken,beef)               FOLLOW BOWEL PREP AND ANY ADDITIONAL PRE OP INSTRUCTIONS YOU RECEIVED FROM YOUR SURGEON'S  OFFICE!!!    The day prior to surgery,  Take MIRALAX according to the following instructions: Dissolve 17 grams in 4 ounces of water and Drink at [employer].       Oral Hygiene is also important to reduce your risk of infection.        Remember - BRUSH YOUR TEETH THE MORNING OF SURGERY WITH YOUR REGULAR TOOTHPASTE   Take ONLY these medicines the morning of surgery with A SIP OF WATER: metoprolol, Atrovent nasal spray  How to Manage Your Diabetes Before and After Surgery  Why is it important to control my blood sugar before and after surgery? Improving blood sugar levels before and after surgery helps healing and can limit problems. A way of improving blood sugar control is eating a healthy diet by:  Eating less sugar and carbohydrates  Increasing activity/exercise  Talking with your doctor about reaching your blood sugar goals High blood sugars (greater than 180 mg/dL) can raise your risk of infections and slow your recovery, so you will need to focus on controlling your diabetes during the weeks before surgery. Make sure that the doctor who takes care of your diabetes knows about your planned surgery including the date and location.  How do I manage my blood sugar before surgery? Check your blood sugar at least 4 times a day, starting 2 days before surgery, to make sure that the level is not too high or low. Check your blood sugar the morning of your surgery when you wake up and every 2 hours until you get to the Short Stay unit. If your blood sugar is less than 70 mg/dL, you will need to treat for low blood sugar: Do not take insulin. Treat a low blood sugar (less than 70 mg/dL) with  cup of clear juice (cranberry or apple), 4 glucose tablets, OR glucose gel. Recheck blood sugar in 15 minutes after treatment (to make sure it is greater than 70 mg/dL). If your blood sugar is not greater than 70 mg/dL on recheck, call [phone] for further instructions. Report your blood sugar to the short  stay nurse when you get to Short Stay.  If you are admitted to the hospital after surgery: Your blood sugar will be checked by the staff and you will probably be given insulin after surgery (instead of oral diabetes medicines) to make sure you have good blood sugar levels. The goal for blood sugar control after surgery is 80-180 mg/dL.   THE DAY BEFORE SURGERY, take   your METFORMIN twice a day as usual.     THE MORNING OF SURGERY, DO NOT TAKE ANY METFORMIN                    You may not have any metal on your body including jewelry, and body piercing  Do not wear lotions, powders, cologne, or deodorant  Men may shave face and neck.  You may bring a small overnight bag with you on the day of surgery, only pack items that are not valuable .[facility] IS NOT RESPONSIBLE   FOR VALUABLES THAT ARE LOST OR STOLEN.   DO NOT BRING YOUR HOME MEDICATIONS TO THE HOSPITAL. PHARMACY WILL DISPENSE MEDICATIONS LISTED ON YOUR MEDICATION LIST TO YOU DURING YOUR ADMISSION IN THE HOSPITAL!   Special Instructions: Bring a copy of your healthcare power of attorney and living will documents the day of surgery, if you wish to have them scanned into your [facility]- EPIC  Please read over the following fact sheets you were given: IF YOU HAVE QUESTIONS ABOUT YOUR PRE-OP INSTRUCTIONS, PLEASE CALL [phone]  (KAY)   [place] - Preparing for Surgery Before surgery, you can play an important role.  Because skin is not sterile, your skin needs to be as free of germs as possible.  You can reduce the number of germs on your skin by washing with CHG (chlorahexidine gluconate) soap before surgery.  CHG is an antiseptic cleaner which kills germs and bonds with the skin to continue killing germs even after washing. Please DO  NOT use if you have an allergy to CHG or antibacterial soaps.  If your skin becomes reddened/irritated stop using the CHG and inform your nurse when you arrive at Short  Stay. Do not shave (including legs and underarms) for at least 48 hours prior to the first CHG shower.  You may shave your face/neck.  Please follow these instructions carefully:  1.  Shower with CHG Soap the night before surgery and the  morning of surgery.  2.  If you choose to wash your hair, wash your hair first as usual with your normal  shampoo.  3.  After you shampoo, rinse your hair and body thoroughly to remove the shampoo.                             4.  Use CHG as you would any other liquid soap.  You can apply chg directly to the skin and wash.  Gently with a scrungie or clean washcloth.  5.  Apply the CHG Soap to your body ONLY FROM THE NECK DOWN.   Do not use on face/ open                           Wound or open sores. Avoid contact with eyes, ears mouth and genitals (private parts).                       Wash face,  Genitals (private parts) with your normal soap.             6.  Wash thoroughly, paying special attention to the area where your  surgery  will be performed.  7.  Thoroughly rinse your body with warm water from the neck down.  8.  DO NOT shower/wash with your normal soap after using and rinsing off the CHG Soap.            9.  Pat yourself dry with a clean towel.            10.  Wear clean pajamas.            11.  Place clean sheets on your bed the night of your first shower and do not  sleep with pets.  ON THE DAY OF SURGERY : Do not apply any lotions/deodorants the morning of surgery.  Please wear clean clothes to the hospital/surgery center.    FAILURE TO FOLLOW THESE INSTRUCTIONS MAY RESULT IN THE CANCELLATION OF YOUR SURGERY  PATIENT SIGNATURE_________________________________  NURSE SIGNATURE__________________________________  ________________________________________________________________________      WHAT IS A BLOOD TRANSFUSION? Blood Transfusion Information  A transfusion is the replacement of blood or some of its parts. Blood is made up of  multiple cells which provide different functions. Red blood cells carry oxygen and are used for blood loss replacement. White blood cells fight against infection. Platelets control bleeding. Plasma helps clot blood. Other blood products are available for specialized needs, such as hemophilia or other clotting disorders. BEFORE THE TRANSFUSION  Who gives blood for transfusions?  Healthy volunteers who are fully evaluated to make sure their blood is safe. This is blood bank blood. Transfusion therapy is the safest it has ever been in the practice of medicine. Before blood is taken from a donor, a complete history is taken to make sure that person has no history of diseases nor engages in risky social behavior (examples are intravenous  drug use or sexual activity with multiple partners). The donor's travel history is screened to minimize risk of transmitting infections, such as malaria. The donated blood is tested for signs of infectious diseases, such as HIV and hepatitis. The blood is then tested to be sure it is compatible with you in order to minimize the chance of a transfusion reaction. If you or a relative donates blood, this is often done in anticipation of surgery and is not appropriate for emergency situations. It takes many days to process the donated blood. RISKS AND COMPLICATIONS Although transfusion therapy is very safe and saves many lives, the main dangers of transfusion include:  Getting an infectious disease. Developing a transfusion reaction. This is an allergic reaction to something in the blood you were given. Every precaution is taken to prevent this. The decision to have a blood transfusion has been considered carefully by your caregiver before blood is given. Blood is not given unless the benefits outweigh the risks. AFTER THE TRANSFUSION Right after receiving a blood transfusion, you will usually feel much better and more energetic. This is especially true if your red blood  cells have gotten low (anemic). The transfusion raises the level of the red blood cells which carry oxygen, and this usually causes an energy increase. The nurse administering the transfusion will monitor you carefully for complications. HOME CARE INSTRUCTIONS  No special instructions are needed after a transfusion. You may find your energy is better. Speak with your caregiver about any limitations on activity for underlying diseases you may have. SEEK MEDICAL CARE IF:  Your condition is not improving after your transfusion. You develop redness or irritation at the intravenous (IV) site. SEEK IMMEDIATE MEDICAL CARE IF:  Any of the following symptoms occur over the next 12 hours: Shaking chills. You have a temperature by mouth above 102 F (38.9 C), not controlled by medicine. Chest, back, or muscle pain. People around you feel you are not acting correctly or are confused. Shortness of breath or difficulty breathing. Dizziness and fainting. You get a rash or develop hives. You have a decrease in urine output. Your urine turns a dark color or changes to pink, red, or brown. Any of the following symptoms occur over the next 10 days: You have a temperature by mouth above 102 F (38.9 C), not controlled by medicine. Shortness of breath. Weakness after normal activity. The white part of the eye turns yellow (jaundice). You have a decrease in the amount of urine or are urinating less often. Your urine turns a dark color or changes to pink, red, or brown. Document Released: 02/11/2000 Document Revised: 05/08/2011 Document Reviewed: 09/30/2007 Exit[employer] Patient Information 2014 [place], [place].  _______________________________________________________________________   Incentive Spirometer    An incentive spirometer is a tool that can help keep your lungs clear and active. This tool measures how well you are filling your lungs with each breath. Taking long deep breaths may help reverse or  decrease the chance of developing breathing (pulmonary) problems (especially infection) following: A long period of time when you are unable to move or be active. BEFORE THE PROCEDURE  If the spirometer includes an indicator to show your best effort, your nurse or respiratory therapist will set it to a desired goal. If possible, sit up straight or lean slightly forward. Try not to slouch. Hold the incentive spirometer in an upright position. INSTRUCTIONS FOR USE  Sit on the edge of your bed if possible, or sit up as far as you can in  bed or on a chair. Hold the incentive spirometer in an upright position. Breathe out normally. Place the mouthpiece in your mouth and seal your lips tightly around it. Breathe in slowly and as deeply as possible, raising the piston or the ball toward the top of the column. Hold your breath for 3-5 seconds or for as long as possible. Allow the piston or ball to fall to the bottom of the column. Remove the mouthpiece from your mouth and breathe out normally. Rest for a few seconds and repeat Steps 1 through 7 at least 10 times every 1-2 hours when you are awake. Take your time and take a few normal breaths between deep breaths. The spirometer may include an indicator to show your best effort. Use the indicator as a goal to work toward during each repetition. After each set of 10 deep breaths, practice coughing to be sure your lungs are clear. If you have an incision (the cut made at the time of surgery), support your incision when coughing by placing a pillow or rolled up towels firmly against it. Once you are able to get out of bed, walk around indoors and cough well. You may stop using the incentive spirometer when instructed by your caregiver.  RISKS AND COMPLICATIONS Take your time so you do not get dizzy or light-headed. If you are in pain, you may need to take or ask for pain medication before doing incentive spirometry. It is harder to take a deep breath if you  are having pain. AFTER USE Rest and breathe slowly and easily. It can be helpful to keep track of a log of your progress. Your caregiver can provide you with a simple table to help with this. If you are using the spirometer at home, follow these instructions: SEEK MEDICAL CARE IF:  You are having difficultly using the spirometer. You have trouble using the spirometer as often as instructed. Your pain medication is not giving enough relief while using the spirometer. You develop fever of 100.5 F (38.1 C) or higher.                                                                                                    SEEK IMMEDIATE MEDICAL CARE IF:  You cough up bloody sputum that had not been present before. You develop fever of 102 F (38.9 C) or greater. You develop worsening pain at or near the incision site. MAKE SURE YOU:  Understand these instructions. Will watch your condition. Will get help right away if you are not doing well or get worse. Document Released: 06/26/2006 Document Revised: 05/08/2011 Document Reviewed: 08/27/2006 [facility] Patient Information 2014 [place], [place].

## 2021-08-24 ENCOUNTER — Encounter (HOSPITAL_COMMUNITY): Payer: Self-pay

## 2021-08-24 ENCOUNTER — Other Ambulatory Visit: Payer: Self-pay

## 2021-08-24 ENCOUNTER — Encounter (HOSPITAL_COMMUNITY)
Admission: RE | Admit: 2021-08-24 | Discharge: 2021-08-24 | Disposition: A | Discharge status: Home / Self Care | Payer: Medicare Other | Source: Ambulatory Visit | Attending: Urology | Admitting: Urology

## 2021-08-24 VITALS — BP 129/81 | HR 53 | Temp 99.1°F | Resp 14 | Ht 72.0 in | Wt 226.0 lb

## 2021-08-24 DIAGNOSIS — E119 Type 2 diabetes mellitus without complications: Secondary | ICD-10-CM | POA: Insufficient documentation

## 2021-08-24 DIAGNOSIS — Z01818 Encounter for other preprocedural examination: Secondary | ICD-10-CM | POA: Diagnosis present

## 2021-08-24 DIAGNOSIS — I10 Essential (primary) hypertension: Secondary | ICD-10-CM | POA: Insufficient documentation

## 2021-08-24 HISTORY — DX: Essential (primary) hypertension: I10

## 2021-08-24 HISTORY — DX: Type 2 diabetes mellitus without complications: E11.9

## 2021-08-24 LAB — CBC
HCT: 45.5 % (ref 39.0–52.0)
Hemoglobin: 13.5 g/dL (ref 13.0–17.0)
MCH: 23.4 pg — ABNORMAL LOW (ref 26.0–34.0)
MCHC: 29.7 g/dL — ABNORMAL LOW (ref 30.0–36.0)
MCV: 79 fL — ABNORMAL LOW (ref 80.0–100.0)
Platelets: 539 10*3/uL — ABNORMAL HIGH (ref 150–400)
RBC: 5.76 MIL/uL (ref 4.22–5.81)
RDW: 17.6 % — ABNORMAL HIGH (ref 11.5–15.5)
WBC: 10.2 10*3/uL (ref 4.0–10.5)
nRBC: 0 % (ref 0.0–0.2)

## 2021-08-24 LAB — GLUCOSE, CAPILLARY: Glucose-Capillary: 94 mg/dL (ref 70–99)

## 2021-08-24 LAB — HEMOGLOBIN A1C
Hgb A1c MFr Bld: 6 % — ABNORMAL HIGH (ref 4.8–5.6)
Mean Plasma Glucose: 125.5 mg/dL

## 2021-09-02 ENCOUNTER — Ambulatory Visit (HOSPITAL_BASED_OUTPATIENT_CLINIC_OR_DEPARTMENT_OTHER): Payer: Medicare Other | Admitting: Anesthesiology

## 2021-09-02 ENCOUNTER — Observation Stay (HOSPITAL_COMMUNITY)
Admission: RE | Admit: 2021-09-02 | Discharge: 2021-09-03 | Disposition: A | Discharge status: Home / Self Care | Payer: Medicare Other | Source: Ambulatory Visit | Attending: Urology | Admitting: Urology

## 2021-09-02 ENCOUNTER — Other Ambulatory Visit: Payer: Self-pay

## 2021-09-02 ENCOUNTER — Encounter (HOSPITAL_COMMUNITY)
Admission: RE | Disposition: A | Discharge status: Home / Self Care | Payer: Self-pay | Source: Ambulatory Visit | Attending: Urology

## 2021-09-02 ENCOUNTER — Ambulatory Visit (HOSPITAL_COMMUNITY): Payer: Medicare Other | Admitting: Anesthesiology

## 2021-09-02 ENCOUNTER — Other Ambulatory Visit (HOSPITAL_COMMUNITY): Payer: Self-pay

## 2021-09-02 ENCOUNTER — Encounter (HOSPITAL_COMMUNITY): Payer: Self-pay | Admitting: Urology

## 2021-09-02 DIAGNOSIS — N281 Cyst of kidney, acquired: Principal | ICD-10-CM | POA: Insufficient documentation

## 2021-09-02 DIAGNOSIS — I129 Hypertensive chronic kidney disease with stage 1 through stage 4 chronic kidney disease, or unspecified chronic kidney disease: Secondary | ICD-10-CM | POA: Insufficient documentation

## 2021-09-02 DIAGNOSIS — R3912 Poor urinary stream: Secondary | ICD-10-CM | POA: Insufficient documentation

## 2021-09-02 DIAGNOSIS — N200 Calculus of kidney: Secondary | ICD-10-CM | POA: Diagnosis not present

## 2021-09-02 DIAGNOSIS — N2889 Other specified disorders of kidney and ureter: Secondary | ICD-10-CM | POA: Insufficient documentation

## 2021-09-02 DIAGNOSIS — Z87891 Personal history of nicotine dependence: Secondary | ICD-10-CM | POA: Insufficient documentation

## 2021-09-02 DIAGNOSIS — E1122 Type 2 diabetes mellitus with diabetic chronic kidney disease: Secondary | ICD-10-CM | POA: Insufficient documentation

## 2021-09-02 DIAGNOSIS — N189 Chronic kidney disease, unspecified: Secondary | ICD-10-CM | POA: Insufficient documentation

## 2021-09-02 DIAGNOSIS — E119 Type 2 diabetes mellitus without complications: Secondary | ICD-10-CM

## 2021-09-02 DIAGNOSIS — Z79899 Other long term (current) drug therapy: Secondary | ICD-10-CM | POA: Diagnosis not present

## 2021-09-02 DIAGNOSIS — N401 Enlarged prostate with lower urinary tract symptoms: Secondary | ICD-10-CM | POA: Diagnosis not present

## 2021-09-02 DIAGNOSIS — Z905 Acquired absence of kidney: Secondary | ICD-10-CM

## 2021-09-02 HISTORY — PX: ROBOTIC ASSITED PARTIAL NEPHRECTOMY: SHX6087

## 2021-09-02 LAB — TYPE AND SCREEN
ABO/RH(D): O POS
Antibody Screen: NEGATIVE

## 2021-09-02 LAB — ABO/RH: ABO/RH(D): O POS

## 2021-09-02 LAB — GLUCOSE, CAPILLARY
Glucose-Capillary: 112 mg/dL — ABNORMAL HIGH (ref 70–99)
Glucose-Capillary: 134 mg/dL — ABNORMAL HIGH (ref 70–99)

## 2021-09-02 LAB — HEMOGLOBIN AND HEMATOCRIT, BLOOD
HCT: 44.4 % (ref 39.0–52.0)
Hemoglobin: 12.8 g/dL — ABNORMAL LOW (ref 13.0–17.0)

## 2021-09-02 SURGERY — NEPHRECTOMY, PARTIAL, ROBOT-ASSISTED
Anesthesia: General | Laterality: Left

## 2021-09-02 MED ORDER — PROPOFOL 10 MG/ML IV BOLUS
INTRAVENOUS | Status: DC | PRN
  Administered 2021-09-02: 130 mg via INTRAVENOUS

## 2021-09-02 MED ORDER — PROPOFOL 10 MG/ML IV BOLUS
INTRAVENOUS | Status: AC
  Filled 2021-09-02: qty 20

## 2021-09-02 MED ORDER — ONDANSETRON HCL 4 MG/2ML IJ SOLN
INTRAMUSCULAR | Status: DC | PRN
  Administered 2021-09-02: 4 mg via INTRAVENOUS

## 2021-09-02 MED ORDER — LIDOCAINE 2% (20 MG/ML) 5 ML SYRINGE
INTRAMUSCULAR | Status: DC | PRN
  Administered 2021-09-02: 60 mg via INTRAVENOUS

## 2021-09-02 MED ORDER — ONDANSETRON HCL 4 MG/2ML IJ SOLN
INTRAMUSCULAR | Status: AC
  Filled 2021-09-02: qty 2

## 2021-09-02 MED ORDER — DEXAMETHASONE SODIUM PHOSPHATE 10 MG/ML IJ SOLN
INTRAMUSCULAR | Status: AC
  Filled 2021-09-02: qty 1

## 2021-09-02 MED ORDER — CHLORHEXIDINE GLUCONATE 0.12 % MT SOLN
15.0000 mL | Freq: Once | OROMUCOSAL | Status: AC
  Administered 2021-09-02: 15 mL via OROMUCOSAL

## 2021-09-02 MED ORDER — MORPHINE SULFATE (PF) 2 MG/ML IV SOLN: 2.0000 mg | INTRAVENOUS | Status: DC | PRN

## 2021-09-02 MED ORDER — LACTATED RINGERS IR SOLN
Status: DC | PRN
  Administered 2021-09-02: 1000 mL

## 2021-09-02 MED ORDER — CEFAZOLIN SODIUM-DEXTROSE 2-4 GM/100ML-% IV SOLN
2.0000 g | Freq: Once | INTRAVENOUS | Status: AC
  Administered 2021-09-02: 2 g via INTRAVENOUS
  Filled 2021-09-02: qty 100

## 2021-09-02 MED ORDER — OXYCODONE-ACETAMINOPHEN 5-325 MG PO TABS
1.0000 | ORAL_TABLET | ORAL | 0 refills | Status: AC | PRN
  Filled 2021-09-02: qty 20, 4d supply, fill #0

## 2021-09-02 MED ORDER — HYDROMORPHONE HCL 1 MG/ML IJ SOLN
0.2500 mg | INTRAMUSCULAR | Status: DC | PRN
  Administered 2021-09-02: 0.5 mg via INTRAVENOUS

## 2021-09-02 MED ORDER — "VISTASEAL 4 ML SINGLE DOSE KIT "
4.0000 mL | PACK | Freq: Once | CUTANEOUS | Status: DC
  Filled 2021-09-02: qty 4

## 2021-09-02 MED ORDER — HEMOSTATIC AGENTS (NO CHARGE) OPTIME
TOPICAL | Status: DC | PRN
  Administered 2021-09-02: 1 via TOPICAL

## 2021-09-02 MED ORDER — TAMSULOSIN HCL 0.4 MG PO CAPS
0.4000 mg | ORAL_CAPSULE | Freq: Every evening | ORAL | Status: DC
  Administered 2021-09-02: 0.4 mg via ORAL
  Filled 2021-09-02: qty 1

## 2021-09-02 MED ORDER — ATORVASTATIN CALCIUM 20 MG PO TABS
20.0000 mg | ORAL_TABLET | Freq: Every day | ORAL | Status: DC
  Administered 2021-09-02: 20 mg via ORAL
  Filled 2021-09-02: qty 1

## 2021-09-02 MED ORDER — DIPHENHYDRAMINE HCL 25 MG PO CAPS
50.0000 mg | ORAL_CAPSULE | Freq: Every day | ORAL | Status: DC
  Administered 2021-09-02: 50 mg via ORAL
  Filled 2021-09-02: qty 2

## 2021-09-02 MED ORDER — LACTATED RINGERS IV SOLN: INTRAVENOUS | Status: DC

## 2021-09-02 MED ORDER — EPHEDRINE 5 MG/ML INJ
INTRAVENOUS | Status: AC
  Filled 2021-09-02: qty 5

## 2021-09-02 MED ORDER — PHENYLEPHRINE HCL (PRESSORS) 10 MG/ML IV SOLN
INTRAVENOUS | Status: DC | PRN
  Administered 2021-09-02: 80 ug via INTRAVENOUS
  Administered 2021-09-02 (×2): 160 ug via INTRAVENOUS

## 2021-09-02 MED ORDER — ROCURONIUM BROMIDE 10 MG/ML (PF) SYRINGE
PREFILLED_SYRINGE | INTRAVENOUS | Status: AC
  Filled 2021-09-02: qty 10

## 2021-09-02 MED ORDER — SENNOSIDES-DOCUSATE SODIUM 8.6-50 MG PO TABS
2.0000 | ORAL_TABLET | Freq: Every day | ORAL | Status: DC
  Administered 2021-09-02: 2 via ORAL
  Filled 2021-09-02: qty 2

## 2021-09-02 MED ORDER — POLYETHYLENE GLYCOL 3350 17 G PO PACK: 17.0000 g | PACK | Freq: Every day | ORAL | Status: DC

## 2021-09-02 MED ORDER — IPRATROPIUM BROMIDE 0.03 % NA SOLN: 2.0000 | Freq: Three times a day (TID) | NASAL | Status: DC | PRN

## 2021-09-02 MED ORDER — DEXAMETHASONE SODIUM PHOSPHATE 10 MG/ML IJ SOLN
INTRAMUSCULAR | Status: DC | PRN
  Administered 2021-09-02: 4 mg via INTRAVENOUS

## 2021-09-02 MED ORDER — ROCURONIUM BROMIDE 10 MG/ML (PF) SYRINGE
PREFILLED_SYRINGE | INTRAVENOUS | Status: DC | PRN
  Administered 2021-09-02: 30 mg via INTRAVENOUS
  Administered 2021-09-02: 40 mg via INTRAVENOUS
  Administered 2021-09-02 (×2): 20 mg via INTRAVENOUS
  Administered 2021-09-02: 60 mg via INTRAVENOUS

## 2021-09-02 MED ORDER — ACETAMINOPHEN 500 MG PO TABS
1000.0000 mg | ORAL_TABLET | Freq: Four times a day (QID) | ORAL | Status: DC
  Administered 2021-09-02 – 2021-09-03 (×3): 1000 mg via ORAL
  Filled 2021-09-02 (×3): qty 2

## 2021-09-02 MED ORDER — SODIUM CHLORIDE (PF) 0.9 % IJ SOLN
INTRAMUSCULAR | Status: DC | PRN
  Administered 2021-09-02: 20 mL

## 2021-09-02 MED ORDER — ONDANSETRON HCL 4 MG/2ML IJ SOLN
4.0000 mg | INTRAMUSCULAR | Status: DC | PRN
Start: 2021-09-02 — End: 2021-09-03

## 2021-09-02 MED ORDER — STERILE WATER FOR IRRIGATION IR SOLN
Status: DC | PRN
  Administered 2021-09-02: 1000 mL

## 2021-09-02 MED ORDER — FENTANYL CITRATE (PF) 100 MCG/2ML IJ SOLN
INTRAMUSCULAR | Status: DC | PRN
  Administered 2021-09-02: 50 ug via INTRAVENOUS
  Administered 2021-09-02: 100 ug via INTRAVENOUS
  Administered 2021-09-02: 50 ug via INTRAVENOUS

## 2021-09-02 MED ORDER — METOPROLOL SUCCINATE ER 25 MG PO TB24
25.0000 mg | ORAL_TABLET | Freq: Every day | ORAL | Status: DC
  Administered 2021-09-03: 25 mg via ORAL
  Filled 2021-09-02: qty 1

## 2021-09-02 MED ORDER — BUPIVACAINE LIPOSOME 1.3 % IJ SUSP
INTRAMUSCULAR | Status: DC | PRN
  Administered 2021-09-02: 20 mL

## 2021-09-02 MED ORDER — SUGAMMADEX SODIUM 200 MG/2ML IV SOLN
INTRAVENOUS | Status: DC | PRN
  Administered 2021-09-02: 200 mg via INTRAVENOUS

## 2021-09-02 MED ORDER — SODIUM CHLORIDE (PF) 0.9 % IJ SOLN
INTRAMUSCULAR | Status: AC
  Filled 2021-09-02: qty 20

## 2021-09-02 MED ORDER — OXYCODONE HCL 5 MG PO TABS
5.0000 mg | ORAL_TABLET | ORAL | Status: DC | PRN
  Administered 2021-09-02: 5 mg via ORAL
  Filled 2021-09-02: qty 1

## 2021-09-02 MED ORDER — DOCUSATE SODIUM 100 MG PO CAPS
100.0000 mg | ORAL_CAPSULE | Freq: Every day | ORAL | 0 refills | Status: AC | PRN
  Filled 2021-09-02: qty 30, 30d supply, fill #0

## 2021-09-02 MED ORDER — LIDOCAINE HCL (PF) 2 % IJ SOLN
INTRAMUSCULAR | Status: AC
  Filled 2021-09-02: qty 5

## 2021-09-02 MED ORDER — HYDROMORPHONE HCL 1 MG/ML IJ SOLN
INTRAMUSCULAR | Status: AC
  Filled 2021-09-02: qty 1

## 2021-09-02 MED ORDER — ORAL CARE MOUTH RINSE: 15.0000 mL | Freq: Once | OROMUCOSAL | Status: AC

## 2021-09-02 MED ORDER — FENTANYL CITRATE (PF) 100 MCG/2ML IJ SOLN
INTRAMUSCULAR | Status: AC
  Filled 2021-09-02: qty 2

## 2021-09-02 MED ORDER — EPHEDRINE SULFATE-NACL 50-0.9 MG/10ML-% IV SOSY
PREFILLED_SYRINGE | INTRAVENOUS | Status: DC | PRN
  Administered 2021-09-02: 10 mg via INTRAVENOUS

## 2021-09-02 MED ORDER — BUPIVACAINE LIPOSOME 1.3 % IJ SUSP
INTRAMUSCULAR | Status: AC
  Filled 2021-09-02: qty 20

## 2021-09-02 MED ORDER — LACTATED RINGERS IV SOLN: INTRAVENOUS | Status: DC | PRN

## 2021-09-02 MED ORDER — ACETAMINOPHEN 500 MG PO TABS
1000.0000 mg | ORAL_TABLET | Freq: Once | ORAL | Status: AC
  Administered 2021-09-02: 1000 mg via ORAL
  Filled 2021-09-02: qty 2

## 2021-09-02 MED ORDER — PANTOPRAZOLE SODIUM 40 MG PO TBEC
40.0000 mg | DELAYED_RELEASE_TABLET | Freq: Every day | ORAL | Status: DC
  Administered 2021-09-03: 40 mg via ORAL
  Filled 2021-09-02: qty 1

## 2021-09-02 SURGICAL SUPPLY — 79 items
APPLICATOR SURGIFLO ENDO (HEMOSTASIS) ×1 IMPLANT
APPLICATOR VISTASEAL 35 (MISCELLANEOUS) ×2 IMPLANT
BAG COUNTER SPONGE SURGICOUNT (BAG) IMPLANT
CHLORAPREP W/TINT 26 (MISCELLANEOUS) ×2 IMPLANT
CLIP LIGATING HEM O LOK PURPLE (MISCELLANEOUS) ×2 IMPLANT
CLIP LIGATING HEMO LOK XL GOLD (MISCELLANEOUS) IMPLANT
CLIP LIGATING HEMO O LOK GREEN (MISCELLANEOUS) ×4 IMPLANT
CLIP SUT LAPRA TY ABSORB (SUTURE) ×5 IMPLANT
COVER SURGICAL LIGHT HANDLE (MISCELLANEOUS) ×2 IMPLANT
COVER TIP SHEARS 8 DVNC (MISCELLANEOUS) ×1 IMPLANT
COVER TIP SHEARS 8MM DA VINCI (MISCELLANEOUS) ×1
CUTTER ECHEON FLEX ENDO 45 340 (ENDOMECHANICALS) IMPLANT
DERMABOND ADVANCED (GAUZE/BANDAGES/DRESSINGS) ×1
DERMABOND ADVANCED .7 DNX12 (GAUZE/BANDAGES/DRESSINGS) ×1 IMPLANT
DRAIN CHANNEL 15F RND FF 3/16 (WOUND CARE) ×1 IMPLANT
DRAPE ARM DVNC X/XI (DISPOSABLE) ×4 IMPLANT
DRAPE COLUMN DVNC XI (DISPOSABLE) ×1 IMPLANT
DRAPE DA VINCI XI ARM (DISPOSABLE) ×4
DRAPE DA VINCI XI COLUMN (DISPOSABLE) ×1
DRAPE INCISE IOBAN 66X45 STRL (DRAPES) ×2 IMPLANT
DRAPE SHEET LG 3/4 BI-LAMINATE (DRAPES) ×2 IMPLANT
DRSG TEGADERM 4X4.75 (GAUZE/BANDAGES/DRESSINGS) ×1 IMPLANT
ELECT PENCIL ROCKER SW 15FT (MISCELLANEOUS) ×2 IMPLANT
ELECT REM PT RETURN 15FT ADLT (MISCELLANEOUS) ×2 IMPLANT
EVACUATOR SILICONE 100CC (DRAIN) ×1 IMPLANT
GAUZE SPONGE 2X2 8PLY STRL LF (GAUZE/BANDAGES/DRESSINGS) IMPLANT
GLOVE BIO SURGEON STRL SZ 6.5 (GLOVE) ×1 IMPLANT
GLOVE BIOGEL PI IND STRL 7.5 (GLOVE) ×2 IMPLANT
GLOVE BIOGEL PI INDICATOR 7.5 (GLOVE) ×2
GLOVE SURG LX 7.5 STRW (GLOVE) ×2
GLOVE SURG LX STRL 7.5 STRW (GLOVE) ×2 IMPLANT
GOWN SRG XL LVL 4 BRTHBL STRL (GOWNS) ×1 IMPLANT
GOWN STRL NON-REIN XL LVL4 (GOWNS) ×1
GOWN STRL REUS W/ TWL LRG LVL3 (GOWN DISPOSABLE) ×2 IMPLANT
GOWN STRL REUS W/TWL LRG LVL3 (GOWN DISPOSABLE) ×2
HEMOSTAT SURGICEL 4X8 (HEMOSTASIS) ×2 IMPLANT
HOLDER FOLEY CATH W/STRAP (MISCELLANEOUS) ×2 IMPLANT
IRRIG SUCT STRYKERFLOW 2 WTIP (MISCELLANEOUS) ×2
IRRIGATION SUCT STRKRFLW 2 WTP (MISCELLANEOUS) ×1 IMPLANT
KIT BASIN OR (CUSTOM PROCEDURE TRAY) ×2 IMPLANT
KIT TURNOVER KIT A (KITS) IMPLANT
LOOP VESSEL MAXI BLUE (MISCELLANEOUS) ×2 IMPLANT
MARKER SKIN DUAL TIP RULER LAB (MISCELLANEOUS) ×3 IMPLANT
NDL INSUFFLATION 14GA 120MM (NEEDLE) ×1 IMPLANT
NEEDLE INSUFFLATION 14GA 120MM (NEEDLE) ×2 IMPLANT
PAD POSITIONING PINK XL (MISCELLANEOUS) ×2 IMPLANT
PROTECTOR NERVE ULNAR (MISCELLANEOUS) ×4 IMPLANT
RELOAD STAPLE 45 2.6 WHT THIN (STAPLE) IMPLANT
SEAL CANN UNIV 5-8 DVNC XI (MISCELLANEOUS) ×4 IMPLANT
SEAL XI 5MM-8MM UNIVERSAL (MISCELLANEOUS) ×4
SET TUBE SMOKE EVAC HIGH FLOW (TUBING) ×2 IMPLANT
SOLUTION ELECTROLUBE (MISCELLANEOUS) ×2 IMPLANT
SPIKE FLUID TRANSFER (MISCELLANEOUS) ×2 IMPLANT
SPONGE GAUZE 2X2 STER 10/PKG (GAUZE/BANDAGES/DRESSINGS) ×1
STAPLE RELOAD 45 WHT (STAPLE) IMPLANT
STAPLE RELOAD 45MM WHITE (STAPLE)
SURGIFLO W/THROMBIN 8M KIT (HEMOSTASIS) ×1 IMPLANT
SUT ETHILON 2 0 PSLX (SUTURE) ×1 IMPLANT
SUT MNCRL AB 4-0 PS2 18 (SUTURE) ×4 IMPLANT
SUT PDS AB 0 CT1 36 (SUTURE) ×2 IMPLANT
SUT PROLENE 4 0 RB 1 (SUTURE) ×1
SUT PROLENE 4-0 RB1 .5 CRCL 36 (SUTURE) ×1 IMPLANT
SUT VIC AB 0 CT1 27 (SUTURE) ×1
SUT VIC AB 0 CT1 27XBRD ANTBC (SUTURE) IMPLANT
SUT VIC AB 2-0 SH 27 (SUTURE) ×6
SUT VIC AB 2-0 SH 27XBRD (SUTURE) ×6 IMPLANT
SUT VICRYL 0 UR6 27IN ABS (SUTURE) IMPLANT
SUT VLOC BARB 180 ABS3/0GR12 (SUTURE) ×4
SUTURE VLOC BRB 180 ABS3/0GR12 (SUTURE) ×2 IMPLANT
SYS BAG RETRIEVAL 10MM (BASKET) ×4
SYSTEM BAG RETRIEVAL 10MM (BASKET) ×1 IMPLANT
TOWEL OR 17X26 10 PK STRL BLUE (TOWEL DISPOSABLE) ×2 IMPLANT
TOWEL OR NON WOVEN STRL DISP B (DISPOSABLE) ×2 IMPLANT
TRAY FOLEY MTR SLVR 16FR STAT (SET/KITS/TRAYS/PACK) ×2 IMPLANT
TRAY LAPAROSCOPIC (CUSTOM PROCEDURE TRAY) ×2 IMPLANT
TROCAR UNIVERSAL OPT 12M 100M (ENDOMECHANICALS) IMPLANT
TROCAR Z THREAD OPTICAL 12X100 (TROCAR) ×1 IMPLANT
TROCAR Z-THREAD OPTICAL 5X100M (TROCAR) ×1 IMPLANT
WATER STERILE IRR 1000ML POUR (IV SOLUTION) ×2 IMPLANT

## 2021-09-02 NOTE — Anesthesia Postprocedure Evaluation (Signed)
Anesthesia Post Note  Patient: Sione Baumgarten.  Procedure(s) Performed: XI ROBOTIC ASSITED PARTIAL NEPHRECTOM, EXCISION OF PERINEPHRIC FAT (Left)     Patient location during evaluation: PACU Anesthesia Type: General Level of consciousness: awake and alert and oriented Pain management: pain level controlled Vital Signs Assessment: post-procedure vital signs reviewed and stable Respiratory status: spontaneous breathing, nonlabored ventilation, respiratory function stable and patient connected to nasal cannula oxygen Cardiovascular status: blood pressure returned to baseline and stable Postop Assessment: no apparent nausea or vomiting Anesthetic complications: no   No notable events documented.  Last Vitals:  Vitals:   09/02/21 0932  BP: 138/72  Pulse: 69  Resp: 16  Temp: 36.7 C  SpO2: 96%    Last Pain:  Vitals:   09/02/21 1013  TempSrc:   PainSc: 0-No pain                 Aradia Estey A.

## 2021-09-02 NOTE — H&P (Signed)
Office Visit Report     08/15/2021   --------------------------------------------------------------------------------   James Yang  MRN: [id number]  DOB: May 23, 1940, 81 year old Male   PRIMARY CARE:  Youlanda Roys. Lovie Macadamia, MD  REFERRING:  Janith Lima, MD  PROVIDER:  Rexene Alberts, M.D.  LOCATION:  [facility] [phone]     --------------------------------------------------------------------------------   CC/HPI: James Yang is a 81 year old male seen in follow-up with a history of nephrolithiasis, bilateral renal cysts, BPH with lower urinary tract symptoms. He is also seen in follow-up with a complex renal cyst.   #1. Complex left renal cyst:  -He was found to have a complex right renal cyst in 05/2016. This was evaluated by Dr. Karsten Ro and noted to be simple.  -Repeat renal ultrasound 06/2021 demonstrated complex mass 2.7 cm in the upper pole of the left kidney that may have a solid component. Is also noted a 9 mm nonobstructive stone in the right kidney.  -Follow-up MRI abdomen 08/10/2021 demonstrated complex exophytic cystic lesion in the at the upper pole mid region of the left kidney that has enhancing component. I measured this to be about 2.7 x 2 cm that is completely exophytic. Of note, he also has a very large posterior simple appearing left renal cyst.  -He denies abdominal pain or flank pain. He denies gross hematuria.   #2. Urolithiasis:  He has had a long history of nephrolithiasis. He reports history of open nephrolithotomy in 1983. Renal ultrasound 05/2018 with bilateral nonobstructing stones.  -Renal ultrasound 06/2021 with 9 mm nonobstructing stone in the right kidney.  He states he passed a stone last year. He has had no recurrent abdominal pain or flank pain since then. He denies fevers or chills, dysuria or gross hematuria.   #3. BPH/LUTS:  He does have some lower urinary tract symptoms including a sensation of incomplete bladder emptying, urinary  frequency, intermittency, urgency, fair flow stream, 1 time nocturia. His IPSS score is 18, Q OL 2. He takes silodosin with benefit. He denies bothersome side effects. He has had stable symptoms since last visit. He has had improved symptoms with finasteride. He is not interested in surgery at this time.   Patient currently denies fever, chills, sweats, nausea, vomiting, abdominal or flank pain, gross hematuria or dysuria.   He denies cardiac history or pulmonary history. He does have a history of a umbilical hernia repair and has mesh. He denies taking anticoagulation other than aspirin.     ALLERGIES: No Allergies    MEDICATIONS: Aspirin  Finasteride 5 mg tablet 1 tablet PO Daily  Silodosin 8 mg capsule 1 capsule PO Q HS  Silodosin 8 mg capsule 1 capsule PO Q HS  Atorvastatin Calcium 20 mg tablet tablet  Diphenhydramine Hcl 50 mg capsule 1 capsule  Fish Oil  Ranitidine Hcl     GU PSH: No GU PSH    NON-GU PSH: Inguinal Hernia Repair > 5 yrs Tonsillectomy..     GU PMH: BPH w/LUTS - 04/12/2021, - 03/30/2020, He does have BPH but his primary voiding symptoms are irritative rather than obstructive. I wanted to try pharmacologic therapy and monitor his PVR., - 2017 Renal calculus - 04/12/2021, - 03/30/2020, He has right renal calculi. There are peripherally located and I will continue to monitor these with a repeat KUB in 6 months., - 2017 Renal cyst - 04/12/2021, - 03/30/2020, By his history it sounds as if he has a simple cyst of the right kidney., - 2017 Weak  Urinary Stream - 04/12/2021 Overactive bladder (Stable), He had what I felt was bladder overactivity but it did not respond to Myrbetriq as well as it did to Rapaflo which he has remained on and is pleased with his voiding symptoms at this time. - 2019, (Stable), Although he does have overactive bladder symptoms his greatest concern is that of a slow hesitant stream in the morning., - 2017, He has primarily overactive bladder symptoms with  minimal obstructive type symptoms., - 2017 Urinary Urgency, His urgency has persisted although may have been improved slightly by the Myrbetriq. - 2017, He has urgency with occasional urge incontinence. A trial of Myrbetriq will be undertaken initially a 25 mg dose., - 2017      PMH Notes: Hypogonadism: He was found to have a slightly low serum testosterone at 307/3.8 in 11/16. I note he had reported no decrease in libido or decreased energy levels. He did indicate having erectile dysfunction.  Testosterone level - 3/17 406/5.9   PSAs:  5/16-3.72  5/17-3.08   History of calculus disease: He reports having had multiple kidney stones in his right kidney that required an open nephrolithotomy back in 1983. Since that time he has passed only one stone and indicates that the last time his kidneys were imaged he had a single stone in the right kidney.   Right renal cyst: He was found to have what was initially thought to be a mass involving his right kidney however further imaging with a CT scan revealed that this was a simple cyst.   NON-GU PMH: GERD    FAMILY HISTORY: Heart Disease - Father   SOCIAL HISTORY: Marital Status: Married Preferred Language: English; Ethnicity: Not Hispanic Or Latino; Race: White Current Smoking Status: Patient does not smoke anymore.  Social Drinker.     REVIEW OF SYSTEMS:    GU Review Male:   Patient denies frequent urination, hard to postpone urination, burning/ pain with urination, get up at night to urinate, leakage of urine, stream starts and stops, trouble starting your stream, have to strain to urinate , erection problems, and penile pain.  Gastrointestinal (Upper):   Patient denies nausea, vomiting, and indigestion/ heartburn.  Gastrointestinal (Lower):   Patient denies diarrhea and constipation.  Constitutional:   Patient denies fever, night sweats, weight loss, and fatigue.  Skin:   Patient denies skin rash/ lesion and itching.  Eyes:   Patient denies  blurred vision and double vision.  Ears/ Nose/ Throat:   Patient denies sore throat and sinus problems.  Hematologic/Lymphatic:   Patient denies swollen glands and easy bruising.  Cardiovascular:   Patient denies leg swelling and chest pains.  Respiratory:   Patient denies cough and shortness of breath.  Endocrine:   Patient denies excessive thirst.  Musculoskeletal:   Patient denies back pain and joint pain.  Neurological:   Patient denies headaches and dizziness.  Psychologic:   Patient denies depression and anxiety.   VITAL SIGNS: None   MULTI-SYSTEM PHYSICAL EXAMINATION:    Constitutional: Well-nourished. No physical deformities. Normally developed. Good grooming.  Respiratory: No labored breathing, no use of accessory muscles.   Cardiovascular: Normal temperature, normal extremity pulses, no swelling, no varicosities.  Gastrointestinal: No mass, no tenderness, no rigidity, non obese abdomen.     Complexity of Data:  Source Of History:  Patient, Medical Record Summary  Records Review:   AUA Symptom Score, Previous Doctor Records, Previous Hospital Records, Previous Patient Records  Urine Test Review:   Urinalysis  X-Ray Review:  MRI Abdomen: Reviewed Films. Reviewed Report. Discussed With Patient.    Notes:                     CLINICAL DATA: Evaluate indeterminate left renal lesion.   EXAM:  MRI ABDOMEN WITHOUT AND WITH CONTRAST   TECHNIQUE:  Multiplanar multisequence MR imaging of the abdomen was performed  both before and after the administration of intravenous contrast.   CONTRAST: 73mL GADAVIST GADOBUTROL 1 MMOL/ML IV SOLN   COMPARISON: Renal ultrasound dated Jul 11, 2021.   FINDINGS:  Lower chest: No acute findings.   Hepatobiliary: No mass or other parenchymal abnormality identified.  Normal appearing gallbladder.   Pancreas: No mass, inflammatory changes, or other parenchymal  abnormality identified.   Spleen: Within normal limits in size and appearance.    Adrenals/Urinary Tract: Bilateral adrenal glands are unremarkable.  Exophytic lesion of the mid region of the left kidney which  correlates with finding on prior ultrasound demonstrates is  heterogeneous T2 signal and T1 hyperintensity and an enhancing  component seen on subtraction imaging. Irregular soft tissue  involving the upper pole of the right kidney with no evidence of  enhancement, likely scarring related to prior ablation. Additional  bilateral T2 hyperintense lesions are seen which are compatible with  simple cysts.   Stomach/Bowel: Normal appearing stomach. Diverticulosis, visualized  bowel is otherwise unremarkable.   Vascular/Lymphatic: No pathologically enlarged lymph nodes  identified. No abdominal aortic aneurysm demonstrated.   Other: None.   Musculoskeletal: No suspicious bone lesions identified.   IMPRESSION:  1. 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  2. Irregular soft tissue involving the upper pole of the right  kidney with no evidence of enhancement, likely scarring related to  prior ablation. Correlate with clinical history.  3. Additional simple appearing bilateral renal cysts are seen  (Bosniak 1).    Electronically Signed  By: Yetta Glassman M.D.  On: 08/10/2021 09:25   PROCEDURES:          Urinalysis Dipstick Dipstick Cont'd  Color: Yellow Bilirubin: Neg mg/dL  Appearance: Clear Ketones: Neg mg/dL  Specific Gravity: 1.025 Blood: Neg ery/uL  pH: <=5.0 Protein: Neg mg/dL  Glucose: Neg mg/dL Urobilinogen: 0.2 mg/dL    Nitrites: Neg    Leukocyte Esterase: Neg leu/uL    ASSESSMENT:      ICD-10 Details  1 GU:   BPH w/LUTS - N40.1   2   Renal cyst - N28.1   3   Renal calculus - N20.0   4   Weak Urinary Stream - R39.12    PLAN:           Orders Labs CBC with Diff, CMP   X-Rays: Chest X-Ray Outside Without I.V. Contrast          Schedule         Document Letter(s):  Created for Patient: Clinical Summary         Notes:   1. New left renal mass diagnosed in 06/2021  -Location: Exophytic left interpole measuring 2.7 x 2 cm  -Hgb: 13.7 in 07/2021  -ECOG: 0  -Creatinine: 0.9 in 07/2021 with estimated GFR 81.5.   Current Disease status: Small left renal mass  Current Treatment plan: Left robotic assisted laparoscopic partial nephrectomy  Last imaging: MRI 08/16/2021   We discussed the fact that 80%  of small renal masses are found to be malignant and that the primary treatment option is surgical excision via either open or laparoscopic approach. Other treatment options discussed include ablative therapies (cryoablation v radiofrequency ablation) and active surveillance. At this point in time, the patient has elected to proceed with*.   The patient was provided information regarding their renal mass including the relative risk of benign versus malignant pathology and the natural history of renal cell carcinoma and other possible malignancies of the kidney. The role of renal biopsy, laboratory testing, and imaging studies to further characterize renal masses and/or the presence of metastatic disease were explained. We discussed the role of active surveillance, surgical therapy with both radical nephrectomy and nephron-sparing surgery, and ablative therapy in the treatment of renal masses. In addition, we discussed our goals of providing an accurate diagnosis and oncologic control while maintaining optimal renal function as appropriate based on the size, location, and complexity of their renal mass as well as their co-morbidities.   We have discussed the risks of treatment in detail including but not limited to bleeding, infection, heart attack, stroke, death, venothromoboembolism, cancer recurrence, injury/damage to surrounding organs and structures, urine leak, the  possibility of open surgical conversion for patients undergoing minimally invasive surgery, the risk of developing chronic kidney disease and its associated implications, and the potential risk of end stage renal disease possibly necessitating dialysis.   I have recommended proceeding with a left robot assisted laparoscopic partial nephrectomy. We did discuss the complexity of his tumor in the potential risk for necessitating a total nephrectomy or the possibility of open surgical conversion if necessary.    #2. History of urolithiasis:  -He thought he passed a stone approximately last year. I offered imaging to assess stone burden however he declines at this time. He prefers to follow-up in 1 year with KUB.   #2. BPH with LUTS: Continue silodosin 8 mg and finasteride 5 mg daily. Discussed risk benefits. He is not interested in surgery at this time.   Cc: Dorothey Baseman, MD        Next Appointment:      Next Appointment: 09/02/2021 07:30 AM    Appointment Type: Surgery     Location: [facility] [phone]    Provider: Jettie Pagan, M.D.    Reason for Visit: [facility]/IP LEFT RA LAP RAD NEPHRECTOMY  Urology Preoperative H&P   Chief Complaint: Left complex renal cyst  History of Present Illness: James Yang. is a 81 y.o. male with a left complex renal cyst here for left robotic assisted laparoscopic partial nephrectomy.    Past Medical History:  Diagnosis Date   Arthritis    lower back and both ankles   Chronic kidney disease    Kidney stones   Diabetes mellitus without complication (HCC)    Diverticulosis    GERD (gastroesophageal reflux disease)    Hyperlipidemia    Hypertension     Past Surgical History:  Procedure Laterality Date   ESOPHAGEAL MANOMETRY N/A 11/06/2018   Procedure: ESOPHAGEAL MANOMETRY (EM);  Surgeon: Napoleon Form, MD;  Location: [facility];  Service: Endoscopy;  Laterality: N/A;   KIDNEY STONE SURGERY Right 1983   Dr. Leonette Monarch    TONSILLECTOMY     UMBILICAL HERNIA REPAIR N/A 03/11/2015   Procedure: HERNIA REPAIR UMBILICAL ADULT;  Surgeon: Nadeen Landau, MD;  Location: [facility] ORS;  Service: General;  Laterality: N/A;    Allergies: No Known Allergies  No family history on file.  Social History:  reports that he quit smoking about 19 years ago. His smoking use included cigarettes. He has a 30.00 pack-year smoking history. He has never used smokeless tobacco. He reports current alcohol use. He reports that he does not use drugs.  ROS: A complete review of systems was performed.  All systems are negative except for pertinent findings as noted.  Physical Exam:  Vital signs in last 24 hours: Temp:  [98.1 F (36.7 C)] 98.1 F (36.7 C) (07/07 0932) Pulse Rate:  [69] 69 (07/07 0932) Resp:  [16] 16 (07/07 0932) BP: (138)/(72) 138/72 (07/07 0932) SpO2:  [96 %] 96 % (07/07 0932) Constitutional:  Alert and oriented, No acute distress Cardiovascular: Regular rate and rhythm Respiratory: Normal respiratory effort, Lungs clear bilaterally GI: Abdomen is soft, nontender, nondistended, no abdominal masses GU: No CVA tenderness Lymphatic: No lymphadenopathy Neurologic: Grossly intact, no focal deficits Psychiatric: Normal mood and affect  Laboratory Data:  No results for input(s): "WBC", "HGB", "HCT", "PLT" in the last 72 hours.  No results for input(s): "NA", "K", "CL", "GLUCOSE", "BUN", "CALCIUM", "CREATININE" in the last 72 hours.  Invalid input(s): "CO3"   Results for orders placed or performed during the hospital encounter of 09/02/21 (from the past 24 hour(s))  Glucose, capillary     Status: Abnormal   Collection Time: 09/02/21  9:28 AM  Result Value Ref Range   Glucose-Capillary 112 (H) 70 - 99 mg/dL   No results found for this or any previous visit (from the past 240 hour(s)).  Renal Function: No results for input(s): "CREATININE" in the last 168 hours. CrCl cannot be calculated (Patient's most recent  lab result is older than the maximum 21 days allowed.).  Radiologic Imaging: No results found.  I independently reviewed the above imaging studies.  Assessment and Plan Kerri Asche. is a 81 y.o. male with a left complex renal cyst here for left robotic assisted laparoscopic partial nephrectomy.    Matt R. Paulanthony Gleaves MD 09/02/2021, 9:51 AM  [facility] Pager: [phone]

## 2021-09-02 NOTE — Discharge Summary (Signed)
[facility] Discharge Summary  Admit date: 09/02/2021  Discharge date and time: 09/03/21   Discharge to: Home  Discharge Service: Urology  Discharge Attending Physician:  Dr. Cardell Peach  Discharge  Diagnoses: Status post nephrectomy  Secondary Diagnosis: Principal Problem:   Status post nephrectomy   OR Procedures: Procedure(s): XI ROBOTIC ASSITED PARTIAL NEPHRECTOM, EXCISION OF PERINEPHRIC FAT 09/02/2021   Ancillary Procedures: None   Discharge Day Services: The patient was seen and examined by the Urology team both in the morning and immediately prior to discharge.  Vital signs and laboratory values were stable and within normal limits.  The physical exam was benign and unchanged and all surgical wounds were examined.  Discharge instructions were explained and all questions answered.  Subjective  No acute events overnight. Pain Controlled. No fever or chills.  Objective Patient Vitals for the past 8 hrs:  BP Temp Temp src Pulse Resp SpO2  09/03/21 1335 (!) 112/59 (!) 97.5 F (36.4 C) Oral 72 18 94 %  09/03/21 1049 (!) 103/54 98.3 F (36.8 C) Oral 64 19 92 %  09/03/21 0821 101/72 -- -- -- -- --   Total I/O In: 139.9 [P.O.:120; I.V.:19.9] Out: 1015 [Urine:1000; Drains:15]  General Appearance:        No acute distress Lungs:                       Normal work of breathing on room air Heart:                                Regular rate and rhythm Abdomen:                         Soft, non-tender, non-distended, incisions clean/dry/intact Extremities:                      Warm and well perfused   Hospital Course:  The patient underwent left robotic renal cyst excision and exploration on 09/02/2021. We were unable to identify the described mass on MRI, and it was felt that this mass was not renal in origin after a thorough intra-operative exploration/inspection. The patient tolerated the procedure well, was extubated in the OR, and afterwards was taken to the PACU for routine  post-surgical care. When stable the patient was transferred to the floor.   The patient did well postoperatively.  The patient's diet was slowly advanced and at the time of discharge was tolerating a regular diet.  The patient was discharged home 1 Day Post-Op, at which point was tolerating a regular solid diet, was able to void spontaneously, have adequate pain control with P.O. pain medication, and could ambulate without difficulty. The patient will follow up with [place] for post op check.   Condition at Discharge: Improved  Discharge Medications:  Allergies as of 09/03/2021   No Known Allergies      Medication List     TAKE these medications    Aspirin Adult Low Dose 81 MG tablet Generic drug: aspirin EC Take 81 mg by mouth daily. Swallow whole.   atorvastatin 20 MG tablet Commonly known as: LIPITOR Take 20 mg by mouth at bedtime.   B-12 PO Take 1,000 mcg by mouth daily.   diphenhydrAMINE HCl (Sleep) 50 MG Caps Take 100 mg by mouth at bedtime.   docusate sodium 100 MG capsule Commonly known as: Colace Take 1 capsule  by mouth daily as needed for up to 30 doses.   Fish Oil 1200 MG Caps Take 2,400 mg by mouth daily.   hydrochlorothiazide 12.5 MG tablet Commonly known as: HYDRODIURIL Take 12.5 mg by mouth daily.   HYDROcodone-acetaminophen 5-325 MG tablet Commonly known as: Norco Take 1-2 tablets by mouth every 4 (four) hours as needed for moderate pain.   ibuprofen 200 MG tablet Commonly known as: ADVIL Take 400 mg by mouth every 6 (six) hours as needed for moderate pain.   ipratropium 0.03 % nasal spray Commonly known as: ATROVENT Place 2 sprays into both nostrils 3 (three) times daily before meals.   metFORMIN 500 MG 24 hr tablet Commonly known as: GLUCOPHAGE-XR Take 500 mg by mouth 2 (two) times daily.   metoprolol succinate 25 MG 24 hr tablet Commonly known as: TOPROL-XL Take 25 mg by mouth in the morning and at bedtime.   omeprazole 20 MG capsule Commonly  known as: PRILOSEC Take 20 mg by mouth at bedtime.   oxyCODONE-acetaminophen 5-325 MG tablet Commonly known as: Percocet Take 1 tablet by mouth every 4 hours as needed for severe pain.   PRESERVISION AREDS 2 PO Take 1 tablet by mouth in the morning and at bedtime.   silodosin 8 MG Caps capsule Commonly known as: RAPAFLO Take 8 mg by mouth at bedtime.

## 2021-09-02 NOTE — Anesthesia Procedure Notes (Signed)
Procedure Name: Intubation Date/Time: 09/02/2021 11:59 AM  Performed by: Niel Hummer, CRNAPre-anesthesia Checklist: Patient identified, Emergency Drugs available, Suction available and Patient being monitored Patient Re-evaluated:Patient Re-evaluated prior to induction Oxygen Delivery Method: Circle system utilized Preoxygenation: Pre-oxygenation with 100% oxygen Induction Type: IV induction Ventilation: Mask ventilation without difficulty Laryngoscope Size: Mac and 4 Grade View: Grade II Tube type: Oral Tube size: 7.5 mm Number of attempts: 1 Airway Equipment and Method: Stylet Placement Confirmation: ETT inserted through vocal cords under direct vision, positive ETCO2 and breath sounds checked- equal and bilateral Secured at: 24 cm Tube secured with: Tape Dental Injury: Teeth and Oropharynx as per pre-operative assessment

## 2021-09-02 NOTE — Transfer of Care (Signed)
Immediate Anesthesia Transfer of Care Note  Patient: James Yang.  Procedure(s) Performed: XI ROBOTIC ASSITED PARTIAL NEPHRECTOM, EXCISION OF PERINEPHRIC FAT (Left)  Patient Location: PACU  Anesthesia Type:General  Level of Consciousness: sedated and lethargic  Airway & Oxygen Therapy: Patient Spontanous Breathing and Patient connected to face mask oxygen  Post-op Assessment: Report given to RN and Post -op Vital signs reviewed and stable  Post vital signs: Reviewed and stable  Last Vitals:  Vitals Value Taken Time  BP    Temp    Pulse 82 09/02/21 1656  Resp 17 09/02/21 1658  SpO2 97 % 09/02/21 1656  Vitals shown include unvalidated device data.  Last Pain:  Vitals:   09/02/21 1013  TempSrc:   PainSc: 0-No pain         Complications: No notable events documented.

## 2021-09-02 NOTE — Anesthesia Preprocedure Evaluation (Addendum)
Anesthesia Evaluation  Patient identified by MRN, date of birth, ID band Patient awake    Reviewed: Allergy & Precautions, H&P , NPO status , Patient's Chart, lab work & pertinent test results  Airway Mallampati: II  TM Distance: >3 FB Neck ROM: Full    Dental no notable dental hx. (+) Teeth Intact, Dental Advisory Given   Pulmonary neg pulmonary ROS, former smoker,    Pulmonary exam normal breath sounds clear to auscultation       Cardiovascular hypertension, Pt. on medications  Rhythm:Regular Rate:Normal     Neuro/Psych negative neurological ROS  negative psych ROS   GI/Hepatic Neg liver ROS, GERD  Medicated,  Endo/Other  diabetes, Type 2, Oral Hypoglycemic Agents  Renal/GU Renal disease  negative genitourinary   Musculoskeletal  (+) Arthritis , Osteoarthritis,    Abdominal   Peds  Hematology negative hematology ROS (+)   Anesthesia Other Findings   Reproductive/Obstetrics negative OB ROS                            Anesthesia Physical Anesthesia Plan  ASA: 2  Anesthesia Plan: General   Post-op Pain Management: Tylenol PO (pre-op)*   Induction: Intravenous  PONV Risk Score and Plan: 3 and Ondansetron, Dexamethasone and Treatment may vary due to age or medical condition  Airway Management Planned: Oral ETT  Additional Equipment:   Intra-op Plan:   Post-operative Plan: Extubation in OR  Informed Consent: I have reviewed the patients History and Physical, chart, labs and discussed the procedure including the risks, benefits and alternatives for the proposed anesthesia with the patient or authorized representative who has indicated his/her understanding and acceptance.     Dental advisory given  Plan Discussed with: CRNA  Anesthesia Plan Comments:         Anesthesia Quick Evaluation

## 2021-09-02 NOTE — Op Note (Addendum)
Operative Note  Preoperative diagnosis:  1.  Left complex exophytic renal cyst  Postoperative diagnosis: 1.  Left complex exophytic renal cyst  Procedure(s): 1.   Left robotic assisted laparoscopic partial nephrectomy of left upper pole renal cyst 2. Intraoperative ultrasound of single retroperitoneal organ with intraoperative interpretation 3. Excision of perinephric fat  Surgeon: Jettie Pagan, MD  Assistants:  Santiago Bur, PGY-4  An assistant was required for this surgical procedure.  The duties of the assistant included but were not limited to suctioning, passing suture, camera manipulation, retraction.  This procedure would not be able to be performed without an Geophysicist/field seismologist.   Anesthesia:  General  Complications:  None  EBL:  45ml  Specimens: 1.  ID Type Source Tests Collected by Time Destination  1 : Left upper pole renal cyst Tissue PATH Other SURGICAL PATHOLOGY Jannifer Hick, MD 09/02/2021 1547   2 : Perinephric fat Tissue PATH Other SURGICAL PATHOLOGY Jannifer Hick, MD 09/02/2021 1615     Drains/Catheters: 1.  15 Fr blake drain  Intraoperative findings:   Single left renal artery, single left renal vein Large left upper pole posterior renal cyst No evidence of exophytic left lateral interpolar cystic lesion. The fat was excised from the kidney and the kidney was completely mobilized with no evidence of a separate lesion. The perirenal fat was excised and sent for pathologic evaluation. A portion of his left upper pole cyst was also excised and sent for pathology. Excellent hemostasis.  Indication:  Boby Eyer. is a 81 y.o. 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 Of note, this was very exophytic. After thorough discussion including all relevant risk benefits and alternatives, the patient presents to the operating today for a left robotic assisted laparoscopic partial  nephrectomy.  Description of procedure: The indications, alternatives, benefits and risks were discussed with the patient and informed consent was obtained.  The patient was brought onto the operating room table, positioned supine and secured to the bed with a safety strap.  All pressure points were carefully padded and pneumatic compression devices were placed on the lower extremities.  After the administration of intravenous antibiotics and general endotracheal anesthesia, a 16 French urethral catheter was inserted to drain the bladder.  The patient was repositioned in the right lateral decubitus with the left side elevated at a 70 degree angle with the right lower leg flexed and the left upper leg extended.  An axillary roll was positioned to protect the brachial plexus and a gel pad was placed to support the back.  Multiple pillows were used to pad beneath and between the lower extremities and to ensure adequate cushioning. The left arm was placed in an armrest and carefully padded. The patient was secured in place across the hips, chest and legs with foam padding and silk tape, and the table was flexed.  The patient was prepped and draped in the standard sterile manner.  The radiographic images were in the room.  Timeout was completed verifying the correct patient, surgical procedure, site and positioning, prior to beginning the procedure.  I passed a Veres needle just lateral to the rectus belly into the abdomen. I was unable to gain access with two passes and thus I proceeded with an open approach. I made an incision superior and lateral to the umbilicus and dissected down to the anterior and posterior rectal sheath to gain access into the abdomen. The 50mm  trocar was placed and insufflation with CO2 to a pressure of 15 mmHg was performed. I introduced the camera and identified a small amount of blood present at the area of the veress needle access. The abdominal cavity was examined for any signs of  injury, adhesions and identification of anatomic landmarks. No injury was identified. There was no active bleeding. Next, the camera trocar was placed approximately 7-1/2 cm inferior and 2 cm medially to the planned position of the left robotic arm which was approximately 2 fingerbreadths inferior to the costal margin. We then placed our right robotic trocar, left robotic trocar and fourth arm trocar.  A 12 mm assistant port was placed periumbilically.  The robot was then docked.  The white line of Toldt was incised and the colon was reflected medially, exposing Gerota's fascia.  The splenorenal and splenophrenic ligaments were incised to mobilize the spleen.  The tail of the pancreas was mobilized medially.  The gonadal vein and ureter were identified and the dissection carried cephalad towards the renal vein.  The renal and lumbar veins were visualized entering the left renal vein.  The main renal artery was identified deep to the vein and carefully dissected.  A vessel loop was placed around the left renal artery and secured with a Weck clip. A vessel loop was placed around the left renal vein and secured with a Weck clip.  I then made an incision in the anterior Gerotas fascia. I dissected down to the kidney and he had several centimeters of fat. I then proceed to completely mobilize the kidney. I cleaned of the anterior, lateral and posterior aspects of the kidney from the upper pole to the lower pole. I could not identify the previously identified lesion on the prior MRI at the expected location of the left lateral interpolar region. I made sure to completely rotate the kidney to the posterior renal hilar fat and there was no exophytic lesion arising from the kidney. The fat came off the kidney quite easily and there was a great well defined plane. I never came through a cyst in this location. I performed intraoperative ultrasound and noted no endophytic lesion in the kidney and I did not observe any  unusual lesion in the perinephric fat. Thus, I proceeded to excise the left upper pole cyst as a partial nephrectomy in this location. This was sent to pathology. Hemostasis was controlled with cautery and no clamping was required. I also removed the entirety of the perinephric fat on the lateral aspect of the kidney where the expected lesion was to be found. I placed surgicel and floseal around the upper pole and hilum. The anterior fascia was reapproximated using weck clips.  The operative field was inspected for bleeding or injury.  The insufflation pressure was reduced, again confirming the absence of bleeding.  Pneumoperitoneum was reestablished and a Blake drain was placed through a laparoscopic port into the perirenal space, away from the repair, and secured at the skin.  The robot was undocked and removed from the operative field.  The trochars were removed under direct visualization.  The periumbilical incision was extended and the specimen retrieval bag was removed.  The specimen was sent to pathology for evaluation.  The anterior fascia at the periumbilical site was carefully closed with 0 PDS and a total of 40 mL of Exparel was injected subcutaneously at the port sites.  The skin incisions were closed with 4-0 Monocryl sutures.  Dermabond was applied.  The patient was repositioned supine.  At the end of the procedure, all counts were correct.  The patient tolerated the procedure well and was taken to recovery in stable condition.  Plan:  Admit overnight. Plan to discharge home tomorrow. Will plan for MRI in a month. Discussed findings with family.  Matt R. Rob Mciver MD [facility]  Pager: [phone]

## 2021-09-02 NOTE — Progress Notes (Signed)
Dr. Cardell Peach notified patient restless on arrival to pacu and pulling at lines/tubes JP dressing now saturated

## 2021-09-03 ENCOUNTER — Other Ambulatory Visit (HOSPITAL_COMMUNITY): Payer: Self-pay

## 2021-09-03 ENCOUNTER — Encounter (HOSPITAL_COMMUNITY): Payer: Self-pay | Admitting: Urology

## 2021-09-03 DIAGNOSIS — N281 Cyst of kidney, acquired: Secondary | ICD-10-CM | POA: Diagnosis not present

## 2021-09-03 LAB — BASIC METABOLIC PANEL
Anion gap: 6 (ref 5–15)
BUN: 17 mg/dL (ref 8–23)
CO2: 27 mmol/L (ref 22–32)
Calcium: 8.6 mg/dL — ABNORMAL LOW (ref 8.9–10.3)
Chloride: 105 mmol/L (ref 98–111)
Creatinine, Ser: 1.33 mg/dL — ABNORMAL HIGH (ref 0.61–1.24)
GFR, Estimated: 55 mL/min — ABNORMAL LOW (ref >60)
Glucose, Bld: 134 mg/dL — ABNORMAL HIGH (ref 70–99)
Potassium: 4.7 mmol/L (ref 3.5–5.1)
Sodium: 138 mmol/L (ref 135–145)

## 2021-09-03 LAB — HEMOGLOBIN AND HEMATOCRIT, BLOOD
HCT: 39.5 % (ref 39.0–52.0)
Hemoglobin: 11.8 g/dL — ABNORMAL LOW (ref 13.0–17.0)

## 2021-09-03 NOTE — Progress Notes (Addendum)
   09/03/21 1045  Mobility  Activity Ambulated with assistance in hallway  Range of Motion/Exercises Active;All extremities  Level of Assistance Standby assist, set-up cues, supervision of patient - no hands on  Assistive Device None  Distance Ambulated (ft) 120 ft  Activity Response Tolerated well   Patient tolerated walking well; RN advised patient to continue to walk in the hall independently. No complaints of nausea/vomiting. Patient utilizing incentive spirometer during waking hours. Patient currently experiencing 0/0 pain.  12:25 Patient urinated 150 cc in urinal, with 1 accidental urine occurrence in the bed.  Sinclair Ship, RN

## 2021-09-03 NOTE — Discharge Instructions (Signed)
Activity:  You are encouraged to ambulate frequently (about every hour during waking hours) to help prevent blood clots from forming in your legs or lungs.  However, you should not engage in any heavy lifting (> 10-15 lbs), strenuous activity, or straining. Diet: You should continue a clear liquid diet until passing gas from below.  Once this occurs, you may advance your diet to a soft diet that would be easy to digest (i.e soups, scrambled eggs, mashed potatoes, etc.) for 24 hours just as you would if getting over a bad stomach flu.  If tolerating this diet well for 24 hours, you may then begin eating regular food.  It will be normal to have some amount of bloating, nausea, and abdominal discomfort intermittently. Prescriptions:  You will be provided a prescription for pain medication to take as needed.  If your pain is not severe enough to require the prescription pain medication, you may take extra strength Tylenol instead.  You should also take an over the counter stool softener (Colace 100 mg twice daily) to avoid straining with bowel movements as the pain medication may constipate you. Finally, you will also be provided a prescription for an antibiotic to begin the day prior to your return visit in the office for catheter removal. Incisions: You may remove your dressing bandages the 2nd day after surgery.  You may start showering (not soaking or bathing in water) 48 hours after surgery and the incisions simply need to be patted dry after the shower.  No additional care is needed. What to call us about: You should call the office [phone]) if you develop fever > 101, persistent vomiting, or the catheter stops draining. Also, feel free to call with any other questions you may have and remember the handout that was provided to you as a reference preoperatively which answers many of the common questions that arise after surgery.

## 2021-09-03 NOTE — Progress Notes (Signed)
Patient states he had no complaints of nausea/vomiting over night, after having a clear liquid diet. Diet has been advanced, per previous electronic order.  Sinclair Ship, RN

## 2021-09-03 NOTE — Progress Notes (Signed)
Patient to be discharged to home this afternoon. Patient and Patient's family given discharge instructions including all discharge Medications and schedules for these Medications. Home care post partial Nephrectomy reviewed with the Patient and the Family. Understanding verbalized. Discharge AVS with the Patient at time of discharge

## 2021-09-03 NOTE — Progress Notes (Signed)
Urology Progress Note   1 Day Post-Op from left renal cyst excision and perinephric fat removal. Mass seen on MRI was not able to be identified intraoperatively and so known renal cyst and some peri-nephric fat were excised and sent for pathology.   Subjective: NAEON. Afebrile and vital signs stable. Creatinine at baseline. Pain well controlled  Objective: Vital signs in last 24 hours: Temp:  [97.7 F (36.5 C)-98.2 F (36.8 C)] 98.1 F (36.7 C) (07/08 0549) Pulse Rate:  [64-86] 64 (07/08 0549) Resp:  [11-21] 20 (07/08 0549) BP: (97-146)/(50-85) 97/50 (07/08 0549) SpO2:  [92 %-98 %] 94 % (07/08 0549) Weight:  [102.5 kg] 102.5 kg (07/07 2210)  Intake/Output from previous day: 07/07 0701 - 07/08 0700 In: 2940 [P.O.:240; I.V.:2600; IV Piggyback:100] Out: 1165 [Urine:1000; Drains:65; Blood:100] Intake/Output this shift: No intake/output data recorded.  Physical Exam:  General: Alert and oriented CV: Regular rate Lungs: No increased work of breathing Abdomen:  Soft, appropriately tender. Incisions c/d/i. JP SS GU: Foley in place draining clear yellow urine  Ext: NT, No erythema  Lab Results: Recent Labs    09/02/21 1717 09/03/21 0558  HGB 12.8* 11.8*  HCT 44.4 39.5   Recent Labs    09/03/21 0558  NA 138  K 4.7  CL 105  CO2 27  GLUCOSE 134*  BUN 17  CREATININE 1.33*  CALCIUM 8.6*    Studies/Results: No results found.  Assessment/Plan:  81 y.o. male s/p left robotic renal cyst and perinephric fat excision.  Overall doing well post-op.   - trial of void - discontinue IV pain meds - discontinue drain - out of bed - regular diet - anticipate discharge today if meets above goals.  Dispo: floor   LOS: 0 days

## 2021-09-06 LAB — SURGICAL PATHOLOGY

## 2021-09-13 ENCOUNTER — Other Ambulatory Visit: Payer: Self-pay | Admitting: Urology

## 2021-09-13 DIAGNOSIS — N281 Cyst of kidney, acquired: Secondary | ICD-10-CM

## 2021-09-27 ENCOUNTER — Other Ambulatory Visit: Payer: Medicare Other

## 2021-10-20 ENCOUNTER — Ambulatory Visit
Admission: RE | Admit: 2021-10-20 | Discharge: 2021-10-20 | Disposition: A | Discharge status: Home / Self Care | Payer: Medicare Other | Source: Ambulatory Visit | Attending: Urology | Admitting: Urology

## 2021-10-20 DIAGNOSIS — N281 Cyst of kidney, acquired: Secondary | ICD-10-CM

## 2021-10-20 MED ORDER — GADOBENATE DIMEGLUMINE 529 MG/ML IV SOLN
20.0000 mL | Freq: Once | INTRAVENOUS | Status: AC | PRN
  Administered 2021-10-20: 20 mL via INTRAVENOUS

## 2022-03-21 ENCOUNTER — Other Ambulatory Visit: Payer: Self-pay | Admitting: Urology

## 2022-03-21 DIAGNOSIS — N281 Cyst of kidney, acquired: Secondary | ICD-10-CM

## 2022-03-30 ENCOUNTER — Telehealth: Payer: Self-pay

## 2022-03-30 NOTE — Telephone Encounter (Signed)
Introductory phone call made to patient prior to his new patient appointment with Dr. Tasia Catchings on 03/31/22 at 10:45am. I introduced myself and asked patient if he had any questions about appointment and asked if he knew where cancer center was located. He stated that he did know where cancer center was located because he rode over today to check it out. He asked as to whether he would have to fill out paperwork tomorrow. I explained that there would be a few pages of paperwork but not much. Patient thanked me for calling.

## 2022-03-31 ENCOUNTER — Inpatient Hospital Stay: Payer: Medicare Other

## 2022-03-31 ENCOUNTER — Encounter: Payer: Self-pay | Admitting: Oncology

## 2022-03-31 ENCOUNTER — Inpatient Hospital Stay: Payer: Medicare Other | Attending: Oncology | Admitting: Oncology

## 2022-03-31 VITALS — BP 131/65 | HR 57 | Temp 97.0°F | Resp 18 | Wt 226.0 lb

## 2022-03-31 DIAGNOSIS — D508 Other iron deficiency anemias: Secondary | ICD-10-CM

## 2022-03-31 DIAGNOSIS — Z87891 Personal history of nicotine dependence: Secondary | ICD-10-CM | POA: Insufficient documentation

## 2022-03-31 DIAGNOSIS — I129 Hypertensive chronic kidney disease with stage 1 through stage 4 chronic kidney disease, or unspecified chronic kidney disease: Secondary | ICD-10-CM | POA: Insufficient documentation

## 2022-03-31 DIAGNOSIS — N189 Chronic kidney disease, unspecified: Secondary | ICD-10-CM | POA: Insufficient documentation

## 2022-03-31 DIAGNOSIS — D509 Iron deficiency anemia, unspecified: Secondary | ICD-10-CM | POA: Insufficient documentation

## 2022-03-31 DIAGNOSIS — E1122 Type 2 diabetes mellitus with diabetic chronic kidney disease: Secondary | ICD-10-CM | POA: Insufficient documentation

## 2022-03-31 DIAGNOSIS — D649 Anemia, unspecified: Secondary | ICD-10-CM

## 2022-03-31 NOTE — Progress Notes (Unsigned)
Patient is referred here by Dawson Bills for thrombocytosis.

## 2022-04-01 ENCOUNTER — Encounter: Payer: Self-pay | Admitting: Oncology

## 2022-04-01 DIAGNOSIS — D509 Iron deficiency anemia, unspecified: Secondary | ICD-10-CM | POA: Insufficient documentation

## 2022-04-01 NOTE — Assessment & Plan Note (Addendum)
Labs are reviewed and discussed with patient. I discussed about option of continue oral iron supplementation and repeat blood work for evaluation of treatment response.  If no significant improvement, then proceed with IV Venofer treatments. Alternative option of proceed with IV Venofer treatments. I discussed about the potential risks including but not limited to allergic reactions/infusion reactions including anaphylactic reactions, phlebitis, high blood pressure, wheezing, SOB, skin rash, weight gain,dark urine, leg swelling, back pain, headache, nausea and fatigue, etc.  Patient prefer to try oral iron supplementation first, If not effective, he is interested in IV Venofer.  He has establish with GI and plan to repeat colonoscopy

## 2022-04-01 NOTE — Progress Notes (Signed)
Hematology/Oncology Consult note [phone] [phone]     REFERRING PROVIDER: Juluis Pitch, MD  ASSESSMENT & PLAN:   IDA (iron deficiency anemia) Labs are reviewed and discussed with patient. I discussed about option of continue oral iron supplementation and repeat blood work for evaluation of treatment response.  If no significant improvement, then proceed with IV Venofer treatments. Alternative option of proceed with IV Venofer treatments. I discussed about the potential risks including but not limited to allergic reactions/infusion reactions including anaphylactic reactions, phlebitis, high blood pressure, wheezing, SOB, skin rash, weight gain,dark urine, leg swelling, back pain, headache, nausea and fatigue, etc.  Patient prefer to try oral iron supplementation first, If not effective, he is interested in IV Venofer.  He has establish with GI and plan to repeat colonoscopy   Orders Placed This Encounter  Procedures   CBC with Differential/Platelet    Standing Status:   Future    Standing Expiration Date:   04/01/2023   Technologist smear review    Standing Status:   Future    Standing Expiration Date:   04/01/2023    Order Specific Question:   Clinical information:    Answer:   anemia   Comprehensive metabolic panel    Standing Status:   Future    Standing Expiration Date:   03/31/2023   Ferritin    Standing Status:   Future    Standing Expiration Date:   04/01/2023   Iron and TIBC    Standing Status:   Future    Standing Expiration Date:   04/01/2023   Retic Panel    Standing Status:   Future    Standing Expiration Date:   04/01/2023   Follow up 4-5 weeks All questions were answered. The patient knows to call the clinic with any problems, questions or concerns.  Earlie Server, MD, PhD [facility] Hematology Oncology 03/31/2022   CHIEF COMPLAINTS/REASON FOR VISIT:  Anemia  ASSESSMENT & PLAN:  No problem-specific Assessment & Plan notes found for  this encounter.  Orders Placed This Encounter  Procedures   CBC with Differential/Platelet    Standing Status:   Future    Standing Expiration Date:   04/01/2023   Technologist smear review    Standing Status:   Future    Standing Expiration Date:   04/01/2023    Order Specific Question:   Clinical information:    Answer:   anemia   Comprehensive metabolic panel    Standing Status:   Future    Standing Expiration Date:   03/31/2023   Ferritin    Standing Status:   Future    Standing Expiration Date:   04/01/2023   Iron and TIBC    Standing Status:   Future    Standing Expiration Date:   04/01/2023   Retic Panel    Standing Status:   Future    Standing Expiration Date:   04/01/2023    All questions were answered. The patient knows to call the clinic with any problems, questions or concerns.  Earlie Server, MD, PhD [facility] Hematology Oncology 03/31/2022     HISTORY OF PRESENTING ILLNESS:  James Yang. is a  82 y.o.  male with PMH listed below who was referred to me for anemia Reviewed patient's recent labs that was done.  He was found to have abnormal CBC on 03/07/2022 cbc showed hb 13, mcv 74, platelet 557 Iron saturation 11, ferritin 3 Some fatigue.  He denies recent chest pain on exertion,  shortness of breath on minimal exertion, pre-syncopal episodes, or palpitations He had not noticed any recent bleeding such as epistaxis, hematuria or hematochezia.  He denies over the counter NSAID ingestion. Last colonoscopy 10 years ago. Cologuard was 5 years ago  he has established care with [facility]   MEDICAL HISTORY:  Past Medical History:  Diagnosis Date   Arthritis    lower back and both ankles   Chronic kidney disease    Kidney stones   Diabetes mellitus without complication ([place])    Diverticulosis    GERD (gastroesophageal reflux disease)    Hyperlipidemia    Hypertension     SURGICAL HISTORY: Past Surgical History:  Procedure Laterality Date   ESOPHAGEAL MANOMETRY N/A  11/06/2018   Procedure: ESOPHAGEAL MANOMETRY (EM);  Surgeon: Mauri Pole, MD;  Location: [facility];  Service: Endoscopy;  Laterality: N/A;   KIDNEY STONE SURGERY Right 1983   Dr. Madelin Headings   ROBOTIC ASSITED PARTIAL NEPHRECTOMY Left 09/02/2021   Procedure: XI ROBOTIC ASSITED PARTIAL NEPHRECTOM, EXCISION OF PERINEPHRIC FAT;  Surgeon: Janith Lima, MD;  Location: [facility];  Service: Urology;  Laterality: Left;   TONSILLECTOMY     UMBILICAL HERNIA REPAIR N/A 03/11/2015   Procedure: HERNIA REPAIR UMBILICAL ADULT;  Surgeon: Leonie Green, MD;  Location: [facility] ORS;  Service: General;  Laterality: N/A;    SOCIAL HISTORY: Social History   Socioeconomic History   Marital status: Married    Spouse name: Not on file   Number of children: Not on file   Years of education: Not on file   Highest education level: Not on file  Occupational History   Not on file  Tobacco Use   Smoking status: Former    Packs/day: 1.00    Years: 30.00    Total pack years: 30.00    Types: Cigarettes    Quit date: 05/29/2002    Years since quitting: 19.8   Smokeless tobacco: Never  Vaping Use   Vaping Use: Never used  Substance and Sexual Activity   Alcohol use: Yes    Comment: occasional   Drug use: No   Sexual activity: Not on file  Other Topics Concern   Not on file  Social History Narrative   Not on file   Social Determinants of Health   Financial Resource Strain: Low Risk  (03/31/2022)   Overall Financial Resource Strain (CARDIA)    Difficulty of Paying Living Expenses: Not hard at all  Food Insecurity: No Food Insecurity (03/31/2022)   Hunger Vital Sign    Worried About Running Out of Food in the Last Year: Never true    [place] in the Last Year: Never true  Transportation Needs: No Transportation Needs (03/31/2022)   PRAPARE - Hydrologist (Medical): No    Lack of Transportation (Non-Medical): No  Physical Activity: Insufficiently Active (03/31/2022)    Exercise Vital Sign    Days of Exercise per Week: 1 day    Minutes of Exercise per Session: 20 min  Stress: No Stress Concern Present (03/31/2022)   [place]    Feeling of Stress : Not at all  Social Connections: [place] (03/31/2022)   Social Connection and Isolation Panel [NHANES]    Frequency of Communication with Friends and Family: Three times a week    Frequency of Social Gatherings with Friends and Family: Once a week    Attends Religious Services: More  than 4 times per year    Active Member of Clubs or Organizations: Yes    Attends Archivist Meetings: 1 to 4 times per year    Marital Status: Married  Human resources officer Violence: Not At Risk (03/31/2022)   Humiliation, Afraid, Rape, and Kick questionnaire    Fear of Current or Ex-Partner: No    Emotionally Abused: No    Physically Abused: No    Sexually Abused: No    FAMILY HISTORY: Family History  Problem Relation Age of Onset   Heart disease Mother    Heart disease Other     ALLERGIES:  has No Known Allergies.  MEDICATIONS:  Current Outpatient Medications  Medication Sig Dispense Refill   aspirin (ASPIRIN ADULT LOW DOSE) 81 MG EC tablet Take 81 mg by mouth daily. Swallow whole.     atorvastatin (LIPITOR) 20 MG tablet Take 20 mg by mouth at bedtime.     Cyanocobalamin (B-12 PO) Take 1,000 mcg by mouth daily.     DiphenhydrAMINE HCl, Sleep, 50 MG CAPS Take 100 mg by mouth at bedtime.     hydrochlorothiazide (HYDRODIURIL) 12.5 MG tablet Take 12.5 mg by mouth daily.     ibuprofen (ADVIL) 200 MG tablet Take 400 mg by mouth every 6 (six) hours as needed for moderate pain.     ipratropium (ATROVENT) 0.03 % nasal spray Place 2 sprays into both nostrils 3 (three) times daily before meals.     Iron-Vitamin C (VITRON-C) 65-125 MG TABS Take by mouth.     metFORMIN (GLUCOPHAGE-XR) 500 MG 24 hr tablet Take 500 mg by mouth 2 (two) times daily.      metoprolol succinate (TOPROL-XL) 25 MG 24 hr tablet Take 25 mg by mouth in the morning and at bedtime.     Multiple Vitamins-Minerals (PRESERVISION AREDS 2 PO) Take 1 tablet by mouth in the morning and at bedtime.     Omega-3 Fatty Acids (FISH OIL) 1200 MG CAPS Take 2,400 mg by mouth daily.     omeprazole (PRILOSEC) 20 MG capsule Take 20 mg by mouth at bedtime.     silodosin (RAPAFLO) 8 MG CAPS capsule Take 8 mg by mouth at bedtime.     docusate sodium (COLACE) 100 MG capsule Take 1 capsule by mouth daily as needed for up to 30 doses. (Patient not taking: Reported on 03/31/2022) 30 capsule 0   HYDROcodone-acetaminophen (NORCO) 5-325 MG tablet Take 1-2 tablets by mouth every 4 (four) hours as needed for moderate pain. (Patient not taking: Reported on 08/16/2021) 12 tablet 0   oxyCODONE-acetaminophen (PERCOCET) 5-325 MG tablet Take 1 tablet by mouth every 4 hours as needed for severe pain. (Patient not taking: Reported on 03/31/2022) 20 tablet 0   No current facility-administered medications for this visit.    Review of Systems  Constitutional:  Positive for fatigue. Negative for appetite change, chills, fever and unexpected weight change.  HENT:   Negative for hearing loss and voice change.   Eyes:  Negative for eye problems and icterus.  Respiratory:  Negative for chest tightness, cough and shortness of breath.   Cardiovascular:  Negative for chest pain and leg swelling.  Gastrointestinal:  Negative for abdominal distention and abdominal pain.  Endocrine: Negative for hot flashes.  Genitourinary:  Negative for difficulty urinating, dysuria and frequency.   Musculoskeletal:  Negative for arthralgias.  Skin:  Negative for itching and rash.  Neurological:  Negative for light-headedness and numbness.  Hematological:  Negative for adenopathy. Does not  bruise/bleed easily.  Psychiatric/Behavioral:  Negative for confusion.     PHYSICAL EXAMINATION: ECOG PERFORMANCE STATUS: 0 - Asymptomatic Vitals:    03/31/22 1133  BP: 131/65  Pulse: (!) 57  Resp: 18  Temp: (!) 97 F (36.1 C)  SpO2: 94%   Filed Weights   03/31/22 1133  Weight: 226 lb (102.5 kg)    Physical Exam Constitutional:      General: He is not in acute distress. HENT:     Head: Normocephalic and atraumatic.  Eyes:     General: No scleral icterus. Cardiovascular:     Rate and Rhythm: Normal rate and regular rhythm.     Heart sounds: Normal heart sounds.  Pulmonary:     Effort: Pulmonary effort is normal. No respiratory distress.     Breath sounds: No wheezing.  Abdominal:     General: Bowel sounds are normal. There is no distension.     Palpations: Abdomen is soft.  Musculoskeletal:        General: No deformity. Normal range of motion.     Cervical back: Normal range of motion and neck supple.  Skin:    General: Skin is warm and dry.     Findings: No erythema or rash.  Neurological:     Mental Status: He is alert and oriented to person, place, and time. Mental status is at baseline.     Cranial Nerves: No cranial nerve deficit.     Coordination: Coordination normal.  Psychiatric:        Mood and Affect: Mood normal.      LABORATORY DATA:  I have reviewed the data as listed    Latest Ref Rng & Units 09/03/2021    5:58 AM 09/02/2021    5:17 PM 08/24/2021    2:20 PM  CBC  WBC 4.0 - 10.5 K/uL   10.2   Hemoglobin 13.0 - 17.0 g/dL 31.5  40.0  86.7   Hematocrit 39.0 - 52.0 % 39.5  44.4  45.5   Platelets 150 - 400 K/uL   539       Latest Ref Rng & Units 09/03/2021    5:58 AM 12/31/2018    9:31 AM  CMP  Glucose 70 - 99 mg/dL 619    BUN 8 - 23 mg/dL 17    Creatinine 5.09 - 1.24 mg/dL 3.26  7.12   Sodium 458 - 145 mmol/L 138    Potassium 3.5 - 5.1 mmol/L 4.7    Chloride 98 - 111 mmol/L 105    CO2 22 - 32 mmol/L 27    Calcium 8.9 - 10.3 mg/dL 8.6     No results found for: "IRON", "TIBC", "FERRITIN", "IRONPCTSAT"   RADIOGRAPHIC STUDIES: I have personally reviewed the radiological images as listed and  agreed with the findings in the report. No results found.

## 2022-04-13 ENCOUNTER — Ambulatory Visit
Admission: RE | Admit: 2022-04-13 | Discharge: 2022-04-13 | Disposition: A | Discharge status: Home / Self Care | Payer: Medicare Other | Source: Ambulatory Visit | Attending: Urology | Admitting: Urology

## 2022-04-13 DIAGNOSIS — N281 Cyst of kidney, acquired: Secondary | ICD-10-CM

## 2022-04-13 MED ORDER — GADOPICLENOL 0.5 MMOL/ML IV SOLN
10.0000 mL | Freq: Once | INTRAVENOUS | Status: AC | PRN
  Administered 2022-04-13: 10 mL via INTRAVENOUS

## 2022-04-27 MED FILL — Iron Sucrose Inj 20 MG/ML (Fe Equiv): INTRAVENOUS | Qty: 10 | Status: AC

## 2022-04-28 ENCOUNTER — Inpatient Hospital Stay: Payer: Medicare Other | Attending: Oncology

## 2022-05-01 ENCOUNTER — Other Ambulatory Visit: Payer: Self-pay

## 2022-05-01 ENCOUNTER — Telehealth: Payer: Self-pay | Admitting: Oncology

## 2022-05-01 MED FILL — Iron Sucrose Inj 20 MG/ML (Fe Equiv): INTRAVENOUS | Qty: 10 | Status: AC

## 2022-05-01 NOTE — Telephone Encounter (Signed)
[place] chat recieved to r/s pt appt for 3/5 to 4 months. Appts have been r/s pt has confirmed

## 2022-05-02 ENCOUNTER — Inpatient Hospital Stay: Payer: Medicare Other | Admitting: Oncology

## 2022-05-02 ENCOUNTER — Inpatient Hospital Stay: Payer: Medicare Other

## 2022-05-02 ENCOUNTER — Ambulatory Visit: Payer: Medicare Other

## 2022-05-02 ENCOUNTER — Ambulatory Visit: Payer: Medicare Other | Admitting: Oncology

## 2022-05-02 ENCOUNTER — Encounter: Payer: Self-pay | Admitting: Family Medicine

## 2022-05-30 ENCOUNTER — Encounter
Admission: RE | Disposition: A | Discharge status: Home / Self Care | Payer: Self-pay | Source: Home / Self Care | Attending: Gastroenterology

## 2022-05-30 ENCOUNTER — Encounter: Payer: Self-pay | Admitting: Anesthesiology

## 2022-05-30 ENCOUNTER — Ambulatory Visit: Payer: Medicare Other | Admitting: Anesthesiology

## 2022-05-30 ENCOUNTER — Ambulatory Visit
Admission: RE | Admit: 2022-05-30 | Discharge: 2022-05-30 | Disposition: A | Discharge status: Home / Self Care | Payer: Medicare Other | Attending: Gastroenterology | Admitting: Gastroenterology

## 2022-05-30 DIAGNOSIS — K2950 Unspecified chronic gastritis without bleeding: Secondary | ICD-10-CM | POA: Diagnosis not present

## 2022-05-30 DIAGNOSIS — E1122 Type 2 diabetes mellitus with diabetic chronic kidney disease: Secondary | ICD-10-CM | POA: Insufficient documentation

## 2022-05-30 DIAGNOSIS — B9681 Helicobacter pylori [H. pylori] as the cause of diseases classified elsewhere: Secondary | ICD-10-CM | POA: Diagnosis not present

## 2022-05-30 DIAGNOSIS — D509 Iron deficiency anemia, unspecified: Secondary | ICD-10-CM | POA: Diagnosis present

## 2022-05-30 DIAGNOSIS — N189 Chronic kidney disease, unspecified: Secondary | ICD-10-CM | POA: Insufficient documentation

## 2022-05-30 DIAGNOSIS — Z87891 Personal history of nicotine dependence: Secondary | ICD-10-CM | POA: Insufficient documentation

## 2022-05-30 DIAGNOSIS — D631 Anemia in chronic kidney disease: Secondary | ICD-10-CM | POA: Diagnosis not present

## 2022-05-30 DIAGNOSIS — K5730 Diverticulosis of large intestine without perforation or abscess without bleeding: Secondary | ICD-10-CM | POA: Diagnosis not present

## 2022-05-30 DIAGNOSIS — K640 First degree hemorrhoids: Secondary | ICD-10-CM | POA: Diagnosis not present

## 2022-05-30 DIAGNOSIS — E669 Obesity, unspecified: Secondary | ICD-10-CM | POA: Diagnosis not present

## 2022-05-30 DIAGNOSIS — I129 Hypertensive chronic kidney disease with stage 1 through stage 4 chronic kidney disease, or unspecified chronic kidney disease: Secondary | ICD-10-CM | POA: Diagnosis not present

## 2022-05-30 DIAGNOSIS — K219 Gastro-esophageal reflux disease without esophagitis: Secondary | ICD-10-CM | POA: Insufficient documentation

## 2022-05-30 DIAGNOSIS — Z7984 Long term (current) use of oral hypoglycemic drugs: Secondary | ICD-10-CM | POA: Diagnosis not present

## 2022-05-30 DIAGNOSIS — E785 Hyperlipidemia, unspecified: Secondary | ICD-10-CM | POA: Insufficient documentation

## 2022-05-30 DIAGNOSIS — M1990 Unspecified osteoarthritis, unspecified site: Secondary | ICD-10-CM | POA: Diagnosis not present

## 2022-05-30 DIAGNOSIS — Z6830 Body mass index (BMI) 30.0-30.9, adult: Secondary | ICD-10-CM | POA: Diagnosis not present

## 2022-05-30 HISTORY — PX: ESOPHAGOGASTRODUODENOSCOPY: SHX5428

## 2022-05-30 HISTORY — PX: COLONOSCOPY WITH PROPOFOL: SHX5780

## 2022-05-30 LAB — GLUCOSE, CAPILLARY: Glucose-Capillary: 101 mg/dL — ABNORMAL HIGH (ref 70–99)

## 2022-05-30 SURGERY — COLONOSCOPY WITH PROPOFOL
Anesthesia: General

## 2022-05-30 MED ORDER — PROPOFOL 1000 MG/100ML IV EMUL
INTRAVENOUS | Status: AC
  Filled 2022-05-30: qty 100

## 2022-05-30 MED ORDER — LIDOCAINE HCL (CARDIAC) PF 100 MG/5ML IV SOSY
PREFILLED_SYRINGE | INTRAVENOUS | Status: DC | PRN
  Administered 2022-05-30: 80 mg via INTRAVENOUS

## 2022-05-30 MED ORDER — PROPOFOL 500 MG/50ML IV EMUL
INTRAVENOUS | Status: DC | PRN
  Administered 2022-05-30: 150 ug/kg/min via INTRAVENOUS

## 2022-05-30 MED ORDER — SODIUM CHLORIDE 0.9 % IV SOLN: INTRAVENOUS | Status: DC

## 2022-05-30 NOTE — Anesthesia Preprocedure Evaluation (Addendum)
Anesthesia Evaluation  Patient identified by MRN, date of birth, ID band Patient awake    Reviewed: Allergy & Precautions, NPO status , Patient's Chart, lab work & pertinent test results  Airway Mallampati: III  TM Distance: >3 FB Neck ROM: full    Dental no notable dental hx.    Pulmonary former smoker   Pulmonary exam normal        Cardiovascular hypertension, Normal cardiovascular exam     Neuro/Psych negative neurological ROS  negative psych ROS   GI/Hepatic Neg liver ROS,GERD  Medicated,,  Endo/Other  diabetes    Renal/GU Renal disease  negative genitourinary   Musculoskeletal  (+) Arthritis ,    Abdominal   Peds  Hematology  (+) Blood dyscrasia, anemia   Anesthesia Other Findings Past Medical History: No date: Arthritis     Comment:  lower back and both ankles No date: Chronic kidney disease     Comment:  Kidney stones No date: Diabetes mellitus without complication No date: Diverticulosis No date: GERD (gastroesophageal reflux disease) No date: Hyperlipidemia No date: Hypertension  Past Surgical History: 11/06/2018: ESOPHAGEAL MANOMETRY; N/A     Comment:  Procedure: ESOPHAGEAL MANOMETRY (EM);  Surgeon:               Mauri Pole, MD;  Location: [facility];                Service: Endoscopy;  Laterality: N/A; 1983: KIDNEY STONE SURGERY; Right     Comment:  Dr. Madelin Headings 09/02/2021: ROBOTIC ASSITED PARTIAL NEPHRECTOMY; Left     Comment:  Procedure: XI ROBOTIC ASSITED PARTIAL NEPHRECTOM,               EXCISION OF PERINEPHRIC FAT;  Surgeon: Janith Lima,               MD;  Location: [facility];  Service: Urology;  Laterality:               Left; No date: TONSILLECTOMY [id number]: UMBILICAL HERNIA REPAIR; N/A     Comment:  Procedure: HERNIA REPAIR UMBILICAL ADULT;  Surgeon:               Leonie Green, MD;  Location: [facility] ORS;  Service:               General;  Laterality: N/A;      Reproductive/Obstetrics negative OB ROS                             Anesthesia Physical Anesthesia Plan  ASA: 2  Anesthesia Plan: General   Post-op Pain Management:    Induction: Intravenous  PONV Risk Score and Plan: Propofol infusion and TIVA  Airway Management Planned: Natural Airway and Nasal Cannula  Additional Equipment:   Intra-op Plan:   Post-operative Plan:   Informed Consent: I have reviewed the patients History and Physical, chart, labs and discussed the procedure including the risks, benefits and alternatives for the proposed anesthesia with the patient or authorized representative who has indicated his/her understanding and acceptance.     Dental Advisory Given  Plan Discussed with: Anesthesiologist, CRNA and Surgeon  Anesthesia Plan Comments: (Patient consented for risks of anesthesia including but not limited to:  - adverse reactions to medications - risk of airway placement if required - damage to eyes, teeth, lips or other oral mucosa - nerve damage due to positioning  - sore throat or hoarseness - Damage to  heart, brain, nerves, lungs, other parts of body or loss of life  Patient voiced understanding.)        Anesthesia Quick Evaluation

## 2022-05-30 NOTE — H&P (Signed)
Outpatient short stay form Pre-procedure 05/30/2022  Lesly Rubenstein, MD  Primary Physician: Juluis Pitch, MD  Reason for visit:  IDA  History of present illness:    82 y/o gentleman with history of obesity, hypertension, and CKD here for EGD/Colonoscopy for IDA. No blood thinners. No family history of GI malignancies. History of umbilical hernia repair.    Current Facility-Administered Medications:    0.9 %  sodium chloride infusion, , Intravenous, Continuous, Almadelia Looman, Hilton Cork, MD, Last Rate: 20 mL/hr at 05/30/22 0924, New Bag at 05/30/22 0924  Medications Prior to Admission  Medication Sig Dispense Refill Last Dose   aspirin (ASPIRIN ADULT LOW DOSE) 81 MG EC tablet Take 81 mg by mouth daily. Swallow whole.   Past Week   atorvastatin (LIPITOR) 20 MG tablet Take 20 mg by mouth at bedtime.   05/29/2022   hydrochlorothiazide (HYDRODIURIL) 12.5 MG tablet Take 12.5 mg by mouth daily.   05/29/2022   Iron-Vitamin C (VITRON-C) 65-125 MG TABS Take by mouth.   Past Week   metoprolol succinate (TOPROL-XL) 25 MG 24 hr tablet Take 25 mg by mouth in the morning and at bedtime.   05/29/2022   Cyanocobalamin (B-12 PO) Take 1,000 mcg by mouth daily.      DiphenhydrAMINE HCl, Sleep, 50 MG CAPS Take 100 mg by mouth at bedtime.      docusate sodium (COLACE) 100 MG capsule Take 1 capsule by mouth daily as needed for up to 30 doses. (Patient not taking: Reported on 03/31/2022) 30 capsule 0    HYDROcodone-acetaminophen (NORCO) 5-325 MG tablet Take 1-2 tablets by mouth every 4 (four) hours as needed for moderate pain. (Patient not taking: Reported on 08/16/2021) 12 tablet 0    ibuprofen (ADVIL) 200 MG tablet Take 400 mg by mouth every 6 (six) hours as needed for moderate pain.      ipratropium (ATROVENT) 0.03 % nasal spray Place 2 sprays into both nostrils 3 (three) times daily before meals.      metFORMIN (GLUCOPHAGE-XR) 500 MG 24 hr tablet Take 500 mg by mouth 2 (two) times daily.      Multiple  Vitamins-Minerals (PRESERVISION AREDS 2 PO) Take 1 tablet by mouth in the morning and at bedtime.      Omega-3 Fatty Acids (FISH OIL) 1200 MG CAPS Take 2,400 mg by mouth daily.      omeprazole (PRILOSEC) 20 MG capsule Take 20 mg by mouth at bedtime.      oxyCODONE-acetaminophen (PERCOCET) 5-325 MG tablet Take 1 tablet by mouth every 4 hours as needed for severe pain. (Patient not taking: Reported on 03/31/2022) 20 tablet 0    silodosin (RAPAFLO) 8 MG CAPS capsule Take 8 mg by mouth at bedtime.        No Known Allergies   Past Medical History:  Diagnosis Date   Arthritis    lower back and both ankles   Chronic kidney disease    Kidney stones   Diabetes mellitus without complication    Diverticulosis    GERD (gastroesophageal reflux disease)    Hyperlipidemia    Hypertension     Review of systems:  Otherwise negative.    Physical Exam  Gen: Alert, oriented. Appears stated age.  HEENT: PERRLA. Lungs: No respiratory distress CV: RRR Abd: soft, benign, no masses Ext: No edema    Planned procedures: Proceed with EGD/colonoscopy. The patient understands the nature of the planned procedure, indications, risks, alternatives and potential complications including but not limited to bleeding, infection,  perforation, damage to internal organs and possible oversedation/side effects from anesthesia. The patient agrees and gives consent to proceed.  Please refer to procedure notes for findings, recommendations and patient disposition/instructions.     Lesly Rubenstein, MD [facility]

## 2022-05-30 NOTE — Op Note (Signed)
[facility] Gastroenterology Patient Name: James Yang Procedure Date: 05/30/2022 9:42 AM MRN: [id number] Account #: [id number] Date of Birth: [birth year] Admit Type: Outpatient Age: 82 Room: [facility] ENDO ROOM 1 Gender: Male Note Status: Finalized Instrument Name: Colonscope [id number] Procedure:             Colonoscopy Indications:           Iron deficiency anemia Providers:             Andrey Farmer MD, MD Referring MD:          Youlanda Roys. Lovie Macadamia, MD (Referring MD) Medicines:             Monitored Anesthesia Care Complications:         No immediate complications. Procedure:             Pre-Anesthesia Assessment:                        - Prior to the procedure, a History and Physical was                         performed, and patient medications and allergies were                         reviewed. The patient is competent. The risks and                         benefits of the procedure and the sedation options and                         risks were discussed with the patient. All questions                         were answered and informed consent was obtained.                         Patient identification and proposed procedure were                         verified by the physician, the nurse, the                         anesthesiologist, the anesthetist and the technician                         in the endoscopy suite. Mental Status Examination:                         alert and oriented. Airway Examination: normal                         oropharyngeal airway and neck mobility. Respiratory                         Examination: clear to auscultation. CV Examination:                         normal. Prophylactic Antibiotics: The patient does not  require prophylactic antibiotics. Prior                         Anticoagulants: The patient has taken no anticoagulant                         or antiplatelet agents. ASA Grade Assessment: II - A                          patient with mild systemic disease. After reviewing                         the risks and benefits, the patient was deemed in                         satisfactory condition to undergo the procedure. The                         anesthesia plan was to use monitored anesthesia care                         (MAC). Immediately prior to administration of                         medications, the patient was re-assessed for adequacy                         to receive sedatives. The heart rate, respiratory                         rate, oxygen saturations, blood pressure, adequacy of                         pulmonary ventilation, and response to care were                         monitored throughout the procedure. The physical                         status of the patient was re-assessed after the                         procedure.                        After obtaining informed consent, the colonoscope was                         passed under direct vision. Throughout the procedure,                         the patient's blood pressure, pulse, and oxygen                         saturations were monitored continuously. The                         Colonoscope was introduced through the anus and  advanced to the the terminal ileum. The colonoscopy                         was performed without difficulty. The patient                         tolerated the procedure well. The quality of the bowel                         preparation was good. The terminal ileum, ileocecal                         valve, appendiceal orifice, and rectum were                         photographed. Findings:      The perianal and digital rectal examinations were normal.      The terminal ileum appeared normal.      Many large-mouthed and small-mouthed diverticula were found in the       sigmoid colon.      Internal hemorrhoids were found during retroflexion. The hemorrhoids       were  Grade I (internal hemorrhoids that do not prolapse).      The exam was otherwise without abnormality on direct and retroflexion       views. Impression:            - The examined portion of the ileum was normal.                        - Diverticulosis in the sigmoid colon.                        - Internal hemorrhoids.                        - The examination was otherwise normal on direct and                         retroflexion views.                        - No specimens collected. Recommendation:        - Discharge patient to home.                        - Resume previous diet.                        - Continue present medications.                        - Repeat colonoscopy is not recommended due to current                         age (68 years or older) for screening purposes.                        - Return to referring physician as previously  scheduled. Procedure Code(s):     --- Professional ---                        [phone], Colonoscopy, flexible; diagnostic, including                         collection of specimen(s) by brushing or washing, when                         performed (separate procedure) Diagnosis Code(s):     --- Professional ---                        K64.0, First degree hemorrhoids                        D50.9, Iron deficiency anemia, unspecified                        K57.30, Diverticulosis of large intestine without                         perforation or abscess without bleeding CPT copyright 2022 American Medical Association. All rights reserved. The codes documented in this report are preliminary and upon coder review may  be revised to meet current compliance requirements. Andrey Farmer MD, MD 05/30/2022 10:13:46 AM Number of Addenda: 0 Note Initiated On: 05/30/2022 9:42 AM Scope Withdrawal Time: 0 hours 7 minutes 39 seconds  Total Procedure Duration: 0 hours 10 minutes 41 seconds  Estimated Blood Loss:  Estimated blood loss:  none.      [facility]

## 2022-05-30 NOTE — Interval H&P Note (Signed)
History and Physical Interval Note:  05/30/2022 9:40 AM  James Yang.  has presented today for surgery, with the diagnosis of FH Colon Polyps IDA.  The various methods of treatment have been discussed with the patient and family. After consideration of risks, benefits and other options for treatment, the patient has consented to  Procedure(s): COLONOSCOPY WITH PROPOFOL (N/A) ESOPHAGOGASTRODUODENOSCOPY (EGD) (N/A) as a surgical intervention.  The patient's history has been reviewed, patient examined, no change in status, stable for surgery.  I have reviewed the patient's chart and labs.  Questions were answered to the patient's satisfaction.     Lesly Rubenstein  Ok to proceed with EGD/Colonoscopy

## 2022-05-30 NOTE — Anesthesia Postprocedure Evaluation (Signed)
Anesthesia Post Note  Patient: James Yang.  Procedure(s) Performed: COLONOSCOPY WITH PROPOFOL ESOPHAGOGASTRODUODENOSCOPY (EGD)  Patient location during evaluation: Endoscopy Anesthesia Type: General Level of consciousness: awake and alert Pain management: pain level controlled Vital Signs Assessment: post-procedure vital signs reviewed and stable Respiratory status: spontaneous breathing, nonlabored ventilation, respiratory function stable and patient connected to nasal cannula oxygen Cardiovascular status: blood pressure returned to baseline and stable Postop Assessment: no apparent nausea or vomiting Anesthetic complications: no   There were no known notable events for this encounter.   Last Vitals:  Vitals:   05/30/22 1031 05/30/22 1041  BP: 118/62 137/75  Pulse: 69 65  Resp: (!) 21 17  Temp:    SpO2: 96% 97%    Last Pain:  Vitals:   05/30/22 1011  TempSrc: Temporal  PainSc: Asleep                 Ilene Qua

## 2022-05-30 NOTE — Transfer of Care (Signed)
Immediate Anesthesia Transfer of Care Note  Patient: James Yang.  Procedure(s) Performed: COLONOSCOPY WITH PROPOFOL ESOPHAGOGASTRODUODENOSCOPY (EGD)  Patient Location: PACU  Anesthesia Type:General  Level of Consciousness: awake and sedated  Airway & Oxygen Therapy: Patient Spontanous Breathing and Patient connected to face mask oxygen  Post-op Assessment: Report given to RN and Post -op Vital signs reviewed and stable  Post vital signs: Reviewed and stable  Last Vitals:  Vitals Value Taken Time  BP    Temp    Pulse    Resp    SpO2      Last Pain:  Vitals:   05/30/22 0905  TempSrc: Temporal  PainSc: 0-No pain         Complications: There were no known notable events for this encounter.

## 2022-05-30 NOTE — Op Note (Signed)
[facility] Gastroenterology Patient Name: James Yang Procedure Date: 05/30/2022 9:43 AM MRN: [id number] Account #: [id number] Date of Birth: 1940-03-05 Admit Type: Outpatient Age: 82 Room: [facility] ENDO ROOM 1 Gender: Male Note Status: Finalized Instrument Name: Upper Endoscope [id number] Procedure:             Upper GI endoscopy Indications:           Iron deficiency anemia Providers:             Andrey Farmer MD, MD Referring MD:          Youlanda Roys. Lovie Macadamia, MD (Referring MD) Medicines:             Monitored Anesthesia Care Complications:         No immediate complications. Estimated blood loss:                         Minimal. Procedure:             Pre-Anesthesia Assessment:                        - Prior to the procedure, a History and Physical was                         performed, and patient medications and allergies were                         reviewed. The patient is competent. The risks and                         benefits of the procedure and the sedation options and                         risks were discussed with the patient. All questions                         were answered and informed consent was obtained.                         Patient identification and proposed procedure were                         verified by the physician, the nurse, the                         anesthesiologist, the anesthetist and the technician                         in the endoscopy suite. Mental Status Examination:                         alert and oriented. Airway Examination: normal                         oropharyngeal airway and neck mobility. Respiratory                         Examination: clear to auscultation. CV Examination:  normal. Prophylactic Antibiotics: The patient does not                         require prophylactic antibiotics. Prior                         Anticoagulants: The patient has taken no anticoagulant                          or antiplatelet agents. ASA Grade Assessment: II - A                         patient with mild systemic disease. After reviewing                         the risks and benefits, the patient was deemed in                         satisfactory condition to undergo the procedure. The                         anesthesia plan was to use monitored anesthesia care                         (MAC). Immediately prior to administration of                         medications, the patient was re-assessed for adequacy                         to receive sedatives. The heart rate, respiratory                         rate, oxygen saturations, blood pressure, adequacy of                         pulmonary ventilation, and response to care were                         monitored throughout the procedure. The physical                         status of the patient was re-assessed after the                         procedure.                        After obtaining informed consent, the endoscope was                         passed under direct vision. Throughout the procedure,                         the patient's blood pressure, pulse, and oxygen                         saturations were monitored continuously. The Endoscope  was introduced through the mouth, and advanced to the                         second part of duodenum. The upper GI endoscopy was                         accomplished without difficulty. The patient tolerated                         the procedure well. Findings:      The examined esophagus was normal.      Scattered mild inflammation characterized by erythema was found in the       gastric antrum. Biopsies were taken with a cold forceps for Helicobacter       pylori testing. Estimated blood loss was minimal.      The exam of the stomach was otherwise normal.      The examined duodenum was normal. Impression:            - Normal esophagus.                        -  Gastritis. Biopsied.                        - Normal examined duodenum. Recommendation:        - Await pathology results.                        - Perform a colonoscopy today. Procedure Code(s):     --- Professional ---                        [phone], Esophagogastroduodenoscopy, flexible,                         transoral; with biopsy, single or multiple Diagnosis Code(s):     --- Professional ---                        K29.70, Gastritis, unspecified, without bleeding                        D50.9, Iron deficiency anemia, unspecified CPT copyright 2022 American Medical Association. All rights reserved. The codes documented in this report are preliminary and upon coder review may  be revised to meet current compliance requirements. Andrey Farmer MD, MD 05/30/2022 10:11:23 AM Number of Addenda: 0 Note Initiated On: 05/30/2022 9:43 AM Estimated Blood Loss:  Estimated blood loss was minimal.      [facility]

## 2022-05-31 ENCOUNTER — Encounter: Payer: Self-pay | Admitting: Gastroenterology

## 2022-05-31 LAB — SURGICAL PATHOLOGY

## 2022-09-01 ENCOUNTER — Inpatient Hospital Stay: Payer: Medicare Other | Attending: Oncology

## 2022-09-01 DIAGNOSIS — D508 Other iron deficiency anemias: Secondary | ICD-10-CM

## 2022-09-01 DIAGNOSIS — D509 Iron deficiency anemia, unspecified: Secondary | ICD-10-CM | POA: Diagnosis not present

## 2022-09-01 DIAGNOSIS — Z87891 Personal history of nicotine dependence: Secondary | ICD-10-CM | POA: Diagnosis not present

## 2022-09-01 DIAGNOSIS — R5383 Other fatigue: Secondary | ICD-10-CM | POA: Insufficient documentation

## 2022-09-01 LAB — COMPREHENSIVE METABOLIC PANEL
ALT: 22 U/L (ref 0–44)
AST: 28 U/L (ref 15–41)
Albumin: 3.9 g/dL (ref 3.5–5.0)
Alkaline Phosphatase: 65 U/L (ref 38–126)
Anion gap: 8 (ref 5–15)
BUN: 16 mg/dL (ref 8–23)
CO2: 24 mmol/L (ref 22–32)
Calcium: 9.1 mg/dL (ref 8.9–10.3)
Chloride: 105 mmol/L (ref 98–111)
Creatinine, Ser: 1.13 mg/dL (ref 0.61–1.24)
GFR, Estimated: >60 mL/min (ref >60)
Glucose, Bld: 120 mg/dL — ABNORMAL HIGH (ref 70–99)
Potassium: 4.3 mmol/L (ref 3.5–5.1)
Sodium: 137 mmol/L (ref 135–145)
Total Bilirubin: 0.6 mg/dL (ref 0.3–1.2)
Total Protein: 6.7 g/dL (ref 6.5–8.1)

## 2022-09-01 LAB — CBC WITH DIFFERENTIAL/PLATELET
Abs Immature Granulocytes: 0.08 10*3/uL — ABNORMAL HIGH (ref 0.00–0.07)
Basophils Absolute: 0.1 10*3/uL (ref 0.0–0.1)
Basophils Relative: 1 %
Eosinophils Absolute: 0.2 10*3/uL (ref 0.0–0.5)
Eosinophils Relative: 2 %
HCT: 52.8 % — ABNORMAL HIGH (ref 39.0–52.0)
Hemoglobin: 16.6 g/dL (ref 13.0–17.0)
Immature Granulocytes: 1 %
Lymphocytes Relative: 14 %
Lymphs Abs: 1.1 10*3/uL (ref 0.7–4.0)
MCH: 26.6 pg (ref 26.0–34.0)
MCHC: 31.4 g/dL (ref 30.0–36.0)
MCV: 84.5 fL (ref 80.0–100.0)
Monocytes Absolute: 0.4 10*3/uL (ref 0.1–1.0)
Monocytes Relative: 6 %
Neutro Abs: 6.1 10*3/uL (ref 1.7–7.7)
Neutrophils Relative %: 76 %
Platelets: 427 10*3/uL — ABNORMAL HIGH (ref 150–400)
RBC: 6.25 MIL/uL — ABNORMAL HIGH (ref 4.22–5.81)
RDW: 17.2 % — ABNORMAL HIGH (ref 11.5–15.5)
WBC Morphology: 0.1
WBC: 8 10*3/uL (ref 4.0–10.5)
nRBC: 0 % (ref 0.0–0.2)

## 2022-09-01 LAB — RETIC PANEL
Immature Retic Fract: 17.1 % — ABNORMAL HIGH (ref 2.3–15.9)
RBC.: 6.16 MIL/uL — ABNORMAL HIGH (ref 4.22–5.81)
Retic Count, Absolute: 102.3 10*3/uL (ref 19.0–186.0)
Retic Ct Pct: 1.7 % (ref 0.4–3.1)
Reticulocyte Hemoglobin: 31.1 pg (ref >27.9)

## 2022-09-01 LAB — IRON AND TIBC
Iron: 87 ug/dL (ref 45–182)
Saturation Ratios: 23 % (ref 17.9–39.5)
TIBC: 374 ug/dL (ref 250–450)
UIBC: 287 ug/dL

## 2022-09-01 LAB — TECHNOLOGIST SMEAR REVIEW: Plt Morphology: NORMAL

## 2022-09-01 LAB — FERRITIN: Ferritin: 22 ng/mL — ABNORMAL LOW (ref 24–336)

## 2022-09-01 MED FILL — Iron Sucrose Inj 20 MG/ML (Fe Equiv): INTRAVENOUS | Qty: 10 | Status: AC

## 2022-09-04 ENCOUNTER — Encounter: Payer: Self-pay | Admitting: Oncology

## 2022-09-04 ENCOUNTER — Inpatient Hospital Stay: Payer: Medicare Other

## 2022-09-04 ENCOUNTER — Inpatient Hospital Stay: Payer: Medicare Other | Admitting: Oncology

## 2022-09-04 VITALS — BP 146/61 | HR 58 | Temp 96.5°F | Resp 18 | Wt 226.0 lb

## 2022-09-04 DIAGNOSIS — D509 Iron deficiency anemia, unspecified: Secondary | ICD-10-CM | POA: Diagnosis not present

## 2022-09-04 DIAGNOSIS — D508 Other iron deficiency anemias: Secondary | ICD-10-CM | POA: Diagnosis not present

## 2022-09-04 NOTE — Progress Notes (Signed)
Hematology/Oncology Progress note [phone] [phone]        REFERRING PROVIDER: Dorothey Baseman, MD CHIEF COMPLAINTS/REASON FOR VISIT:  Anemia  ASSESSMENT & PLAN:  IDA (iron deficiency anemia) Labs are reviewed and discussed with patient. Lab Results  Component Value Date   HGB 16.6 09/01/2022   TIBC 374 09/01/2022   IRONPCTSAT 23 09/01/2022   FERRITIN 22 (L) 09/01/2022     No need for IV Venofer  Recommend Vitron C 1 tab every other day.    Orders Placed This Encounter  Procedures   CBC (Cancer Center Only)    Standing Status:   Future    Standing Expiration Date:   09/04/2023   Iron and TIBC    Standing Status:   Future    Standing Expiration Date:   09/04/2023   Ferritin    Standing Status:   Future    Standing Expiration Date:   09/04/2023   Retic Panel    Standing Status:   Future    Standing Expiration Date:   09/04/2023   Follow up in 1 year.  All questions were answered. The patient knows to call the clinic with any problems, questions or concerns.  Rickard Patience, MD, PhD [facility] Hematology Oncology 09/04/2022     HISTORY OF PRESENTING ILLNESS:  James Vreeland. is a  82 y.o.  male with PMH listed below who was referred to me for anemia Reviewed patient's recent labs that was done.  He was found to have abnormal CBC on 03/07/2022 cbc showed hb 13, mcv 74, platelet 557 Iron saturation 11, ferritin 3 Some fatigue.  He denies recent chest pain on exertion, shortness of breath on minimal exertion, pre-syncopal episodes, or palpitations He had not noticed any recent bleeding such as epistaxis, hematuria or hematochezia.  He denies over the counter NSAID ingestion. Last colonoscopy 10 years ago. Cologuard was 5 years ago  he has established care with [facility] GI  INTERVAL HISTORY James Yang. is a 82 y.o. male who has above history reviewed by me today presents for follow up visit for  IDA S/p colonoscopy, findings include  diverticulosis in sigmoid colon, interval hemorrhoids.   MEDICAL HISTORY:  Past Medical History:  Diagnosis Date   Arthritis    lower back and both ankles   Chronic kidney disease    Kidney stones   Diabetes mellitus without complication (HCC)    Diverticulosis    GERD (gastroesophageal reflux disease)    Hyperlipidemia    Hypertension     SURGICAL HISTORY: Past Surgical History:  Procedure Laterality Date   COLONOSCOPY WITH PROPOFOL N/A 05/30/2022   Procedure: COLONOSCOPY WITH PROPOFOL;  Surgeon: Regis Bill, MD;  Location: [facility] ENDOSCOPY;  Service: Endoscopy;  Laterality: N/A;   ESOPHAGEAL MANOMETRY N/A 11/06/2018   Procedure: ESOPHAGEAL MANOMETRY (EM);  Surgeon: Napoleon Form, MD;  Location: [facility] ENDOSCOPY;  Service: Endoscopy;  Laterality: N/A;   ESOPHAGOGASTRODUODENOSCOPY N/A 05/30/2022   Procedure: ESOPHAGOGASTRODUODENOSCOPY (EGD);  Surgeon: Regis Bill, MD;  Location: [facility] ENDOSCOPY;  Service: Endoscopy;  Laterality: N/A;   KIDNEY STONE SURGERY Right 1983   Dr. Leonette Monarch   ROBOTIC ASSITED PARTIAL NEPHRECTOMY Left 09/02/2021   Procedure: XI ROBOTIC ASSITED PARTIAL NEPHRECTOM, EXCISION OF PERINEPHRIC FAT;  Surgeon: Jannifer Hick, MD;  Location: [facility] ORS;  Service: Urology;  Laterality: Left;   TONSILLECTOMY     UMBILICAL HERNIA REPAIR N/A 03/11/2015   Procedure: HERNIA REPAIR UMBILICAL ADULT;  Surgeon: Nadeen Landau, MD;  Location: [facility] ORS;  Service: General;  Laterality: N/A;    SOCIAL HISTORY: Social History   Socioeconomic History   Marital status: Married    Spouse name: Not on file   Number of children: Not on file   Years of education: Not on file   Highest education level: Not on file  Occupational History   Not on file  Tobacco Use   Smoking status: Former    Packs/day: 1.00    Years: 30.00    Additional pack years: 0.00    Total pack years: 30.00    Types: Cigarettes    Quit date: 05/29/2002    Years since quitting: 20.2   Smokeless  tobacco: Never  Vaping Use   Vaping Use: Never used  Substance and Sexual Activity   Alcohol use: Yes    Comment: occasional   Drug use: No   Sexual activity: Not on file  Other Topics Concern   Not on file  Social History Narrative   Not on file   Social Determinants of Health   Financial Resource Strain: Low Risk  (03/31/2022)   Overall Financial Resource Strain (CARDIA)    Difficulty of Paying Living Expenses: Not hard at all  Food Insecurity: No Food Insecurity (03/31/2022)   Hunger Vital Sign    Worried About Running Out of Food in the Last Year: Never true    Ran Out of Food in the Last Year: Never true  Transportation Needs: No Transportation Needs (03/31/2022)   PRAPARE - Administrator, Civil Service (Medical): No    Lack of Transportation (Non-Medical): No  Physical Activity: Insufficiently Active (03/31/2022)   Exercise Vital Sign    Days of Exercise per Week: 1 day    Minutes of Exercise per Session: 20 min  Stress: No Stress Concern Present (03/31/2022)   [employer] of Occupational Health - Occupational Stress Questionnaire    Feeling of Stress : Not at all  Social Connections: Socially Integrated (03/31/2022)   Social Connection and Isolation Panel [NHANES]    Frequency of Communication with Friends and Family: Three times a week    Frequency of Social Gatherings with Friends and Family: Once a week    Attends Religious Services: More than 4 times per year    Active Member of [employer] or Organizations: Yes    Attends Banker Meetings: 1 to 4 times per year    Marital Status: Married  Catering manager Violence: Not At Risk (03/31/2022)   Humiliation, Afraid, Rape, and Kick questionnaire    Fear of Current or Ex-Partner: No    Emotionally Abused: No    Physically Abused: No    Sexually Abused: No    FAMILY HISTORY: Family History  Problem Relation Age of Onset   Heart disease Mother    Heart disease Other     ALLERGIES:  has No Known  Allergies.  MEDICATIONS:  Current Outpatient Medications  Medication Sig Dispense Refill   aspirin (ASPIRIN ADULT LOW DOSE) 81 MG EC tablet Take 81 mg by mouth daily. Swallow whole.     atorvastatin (LIPITOR) 20 MG tablet Take 20 mg by mouth at bedtime.     Cyanocobalamin (B-12 PO) Take 1,000 mcg by mouth daily.     DiphenhydrAMINE HCl, Sleep, 50 MG CAPS Take 100 mg by mouth at bedtime.     docusate sodium (COLACE) 100 MG capsule Take 1 capsule by mouth daily as needed for up to 30 doses. 30 capsule 0  hydrochlorothiazide (HYDRODIURIL) 12.5 MG tablet Take 12.5 mg by mouth daily.     ibuprofen (ADVIL) 200 MG tablet Take 400 mg by mouth every 6 (six) hours as needed for moderate pain.     ipratropium (ATROVENT) 0.03 % nasal spray Place 2 sprays into both nostrils 3 (three) times daily before meals.     Iron-Vitamin C (VITRON-C) 65-125 MG TABS Take by mouth.     metFORMIN (GLUCOPHAGE-XR) 500 MG 24 hr tablet Take 500 mg by mouth 2 (two) times daily.     metoprolol succinate (TOPROL-XL) 25 MG 24 hr tablet Take 25 mg by mouth in the morning and at bedtime.     Multiple Vitamins-Minerals (PRESERVISION AREDS 2 PO) Take 1 tablet by mouth in the morning and at bedtime.     Omega-3 Fatty Acids (FISH OIL) 1200 MG CAPS Take 2,400 mg by mouth daily.     omeprazole (PRILOSEC) 20 MG capsule Take 20 mg by mouth at bedtime.     silodosin (RAPAFLO) 8 MG CAPS capsule Take 8 mg by mouth at bedtime.     HYDROcodone-acetaminophen (NORCO) 5-325 MG tablet Take 1-2 tablets by mouth every 4 (four) hours as needed for moderate pain. (Patient not taking: Reported on 08/16/2021) 12 tablet 0   oxyCODONE-acetaminophen (PERCOCET) 5-325 MG tablet Take 1 tablet by mouth every 4 hours as needed for severe pain. (Patient not taking: Reported on 09/04/2022) 20 tablet 0   No current facility-administered medications for this visit.    Review of Systems  Constitutional:  Positive for fatigue. Negative for appetite change, chills,  fever and unexpected weight change.  HENT:   Negative for hearing loss and voice change.   Eyes:  Negative for eye problems and icterus.  Respiratory:  Negative for chest tightness, cough and shortness of breath.   Cardiovascular:  Negative for chest pain and leg swelling.  Gastrointestinal:  Negative for abdominal distention and abdominal pain.  Endocrine: Negative for hot flashes.  Genitourinary:  Negative for difficulty urinating, dysuria and frequency.   Musculoskeletal:  Negative for arthralgias.  Skin:  Negative for itching and rash.  Neurological:  Negative for light-headedness and numbness.  Hematological:  Negative for adenopathy. Does not bruise/bleed easily.  Psychiatric/Behavioral:  Negative for confusion.     PHYSICAL EXAMINATION: ECOG PERFORMANCE STATUS: 0 - Asymptomatic Vitals:   09/04/22 1421  BP: (!) 146/61  Pulse: (!) 58  Resp: 18  Temp: (!) 96.5 F (35.8 C)  SpO2: 97%   Filed Weights   09/04/22 1421  Weight: 226 lb (102.5 kg)    Physical Exam Constitutional:      General: He is not in acute distress. HENT:     Head: Normocephalic and atraumatic.  Eyes:     General: No scleral icterus. Cardiovascular:     Rate and Rhythm: Normal rate and regular rhythm.     Heart sounds: Normal heart sounds.  Pulmonary:     Effort: Pulmonary effort is normal. No respiratory distress.     Breath sounds: No wheezing.  Abdominal:     General: Bowel sounds are normal. There is no distension.     Palpations: Abdomen is soft.  Musculoskeletal:        General: No deformity. Normal range of motion.     Cervical back: Normal range of motion and neck supple.  Skin:    General: Skin is warm and dry.     Findings: No erythema or rash.  Neurological:     Mental Status: He  is alert and oriented to person, place, and time. Mental status is at baseline.     Cranial Nerves: No cranial nerve deficit.     Coordination: Coordination normal.  Psychiatric:        Mood and Affect:  Mood normal.      LABORATORY DATA:  I have reviewed the data as listed    Latest Ref Rng & Units 09/01/2022   12:55 PM 09/03/2021    5:58 AM 09/02/2021    5:17 PM  CBC  WBC 4.0 - 10.5 K/uL 8.0     Hemoglobin 13.0 - 17.0 g/dL 16.1  09.6  04.5   Hematocrit 39.0 - 52.0 % 52.8  39.5  44.4   Platelets 150 - 400 K/uL 427         Latest Ref Rng & Units 09/01/2022   12:55 PM 09/03/2021    5:58 AM 12/31/2018    9:31 AM  CMP  Glucose 70 - 99 mg/dL 409  811    BUN 8 - 23 mg/dL 16  17    Creatinine 9.14 - 1.24 mg/dL 7.82  9.56  2.13   Sodium 135 - 145 mmol/L 137  138    Potassium 3.5 - 5.1 mmol/L 4.3  4.7    Chloride 98 - 111 mmol/L 105  105    CO2 22 - 32 mmol/L 24  27    Calcium 8.9 - 10.3 mg/dL 9.1  8.6    Total Protein 6.5 - 8.1 g/dL 6.7     Total Bilirubin 0.3 - 1.2 mg/dL 0.6     Alkaline Phos 38 - 126 U/L 65     AST 15 - 41 U/L 28     ALT 0 - 44 U/L 22         Component Value Date/Time   IRON 87 09/01/2022 1255   TIBC 374 09/01/2022 1255   FERRITIN 22 (L) 09/01/2022 1255   IRONPCTSAT 23 09/01/2022 1255     RADIOGRAPHIC STUDIES: I have personally reviewed the radiological images as listed and agreed with the findings in the report. No results found.

## 2022-09-04 NOTE — Assessment & Plan Note (Addendum)
Labs are reviewed and discussed with patient. Lab Results  Component Value Date   HGB 16.6 09/01/2022   TIBC 374 09/01/2022   IRONPCTSAT 23 09/01/2022   FERRITIN 22 (L) 09/01/2022     No need for IV Venofer  Recommend Vitron C 1 tab every other day.

## 2022-10-13 IMAGING — US US ABDOMEN LIMITED
1 series · 14 of 25 positions shown · non-contrast
Comparison: None Available.

CLINICAL DATA: Right upper quadrant pain for a week

EXAM:
ULTRASOUND ABDOMEN LIMITED RIGHT UPPER QUADRANT

[Series 1: us abdomen limited · 0.25mm/px · 14 of 46 slices shown]
[im 1/46]
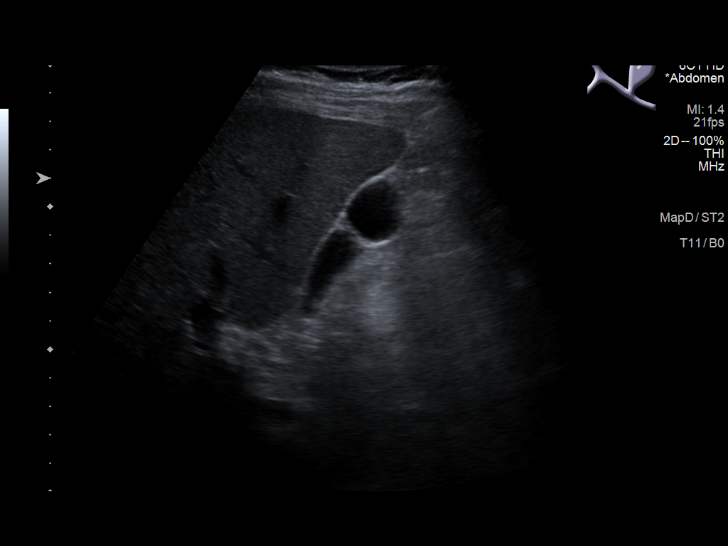
[im 4/46]
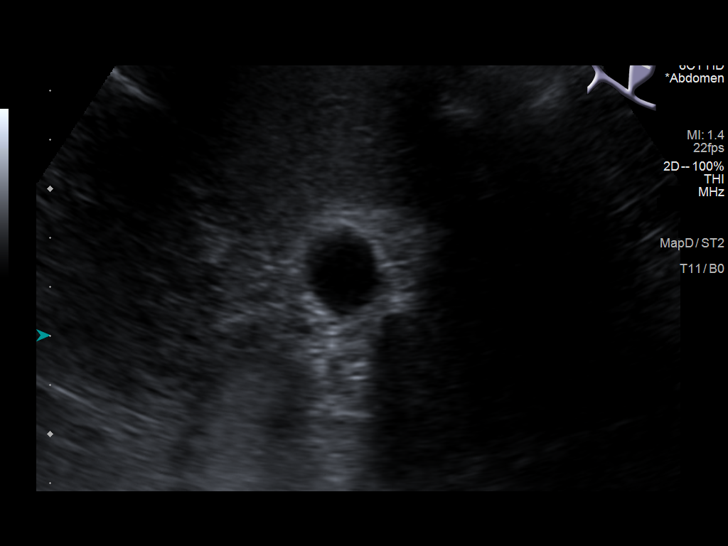
[im 8/46]
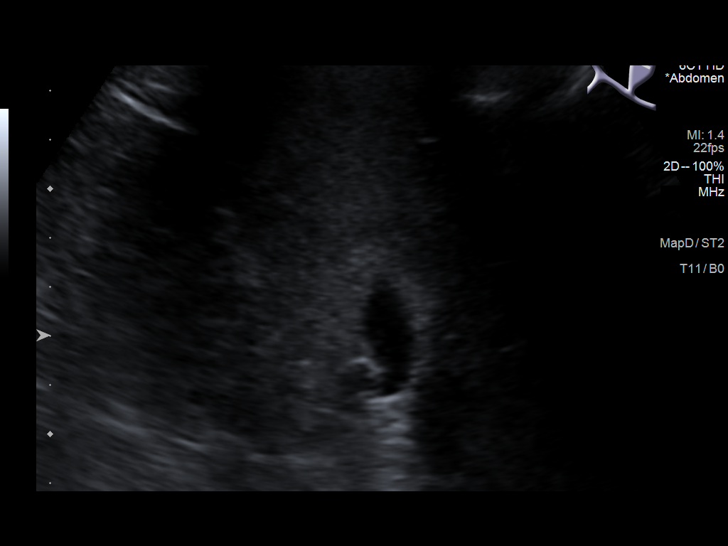
[im 12/46]
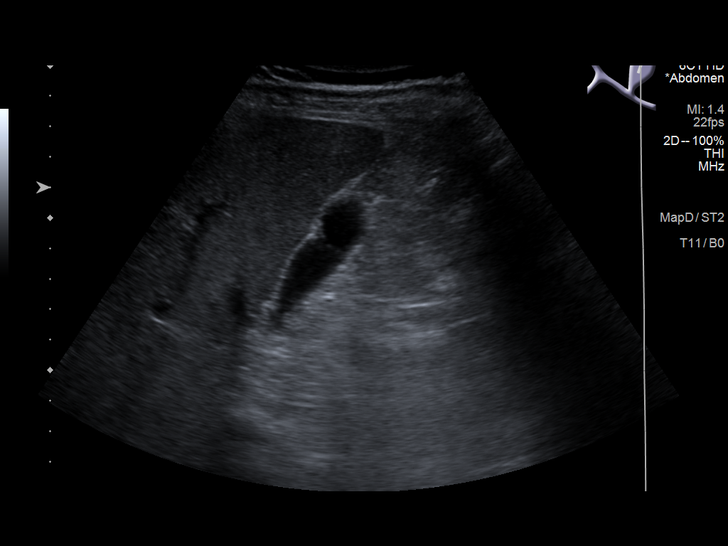
[im 16/46]
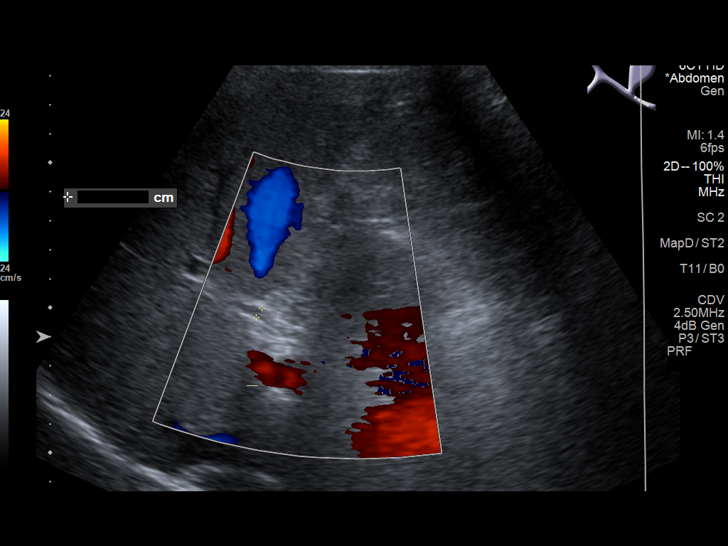
[im 17/46]
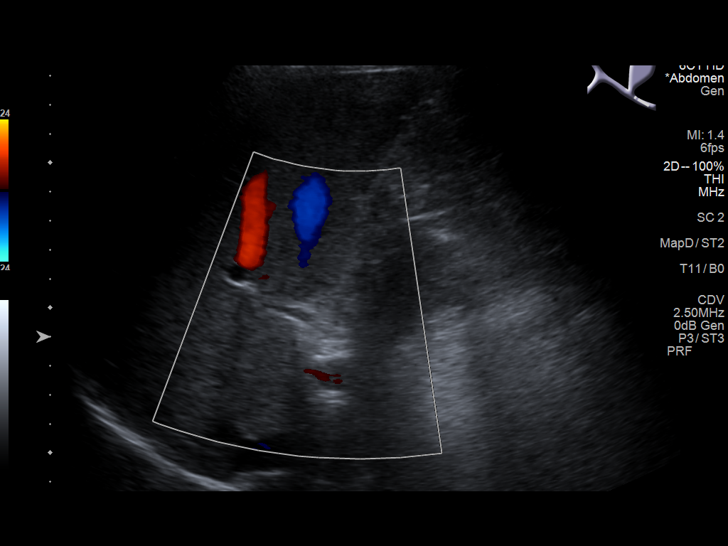
[im 21/46]
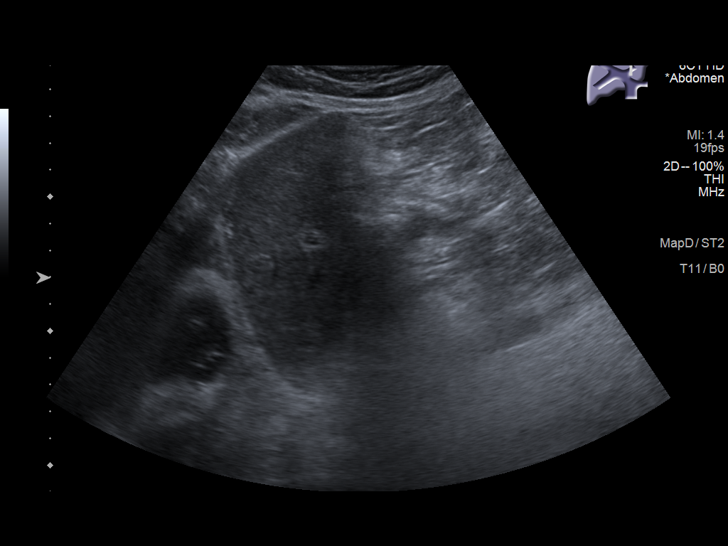
[im 25/46]
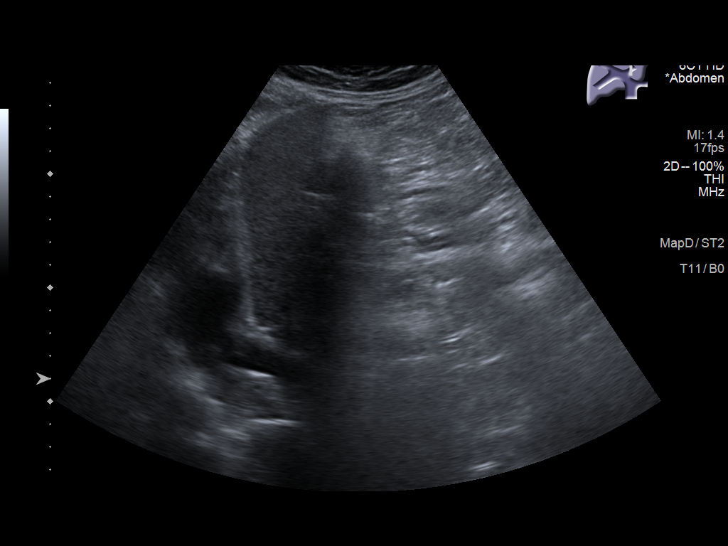
[im 29/46]
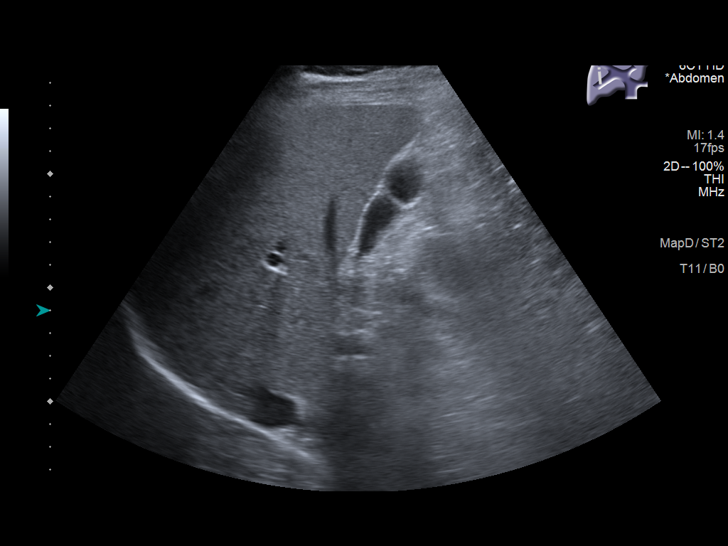
[im 31/46]
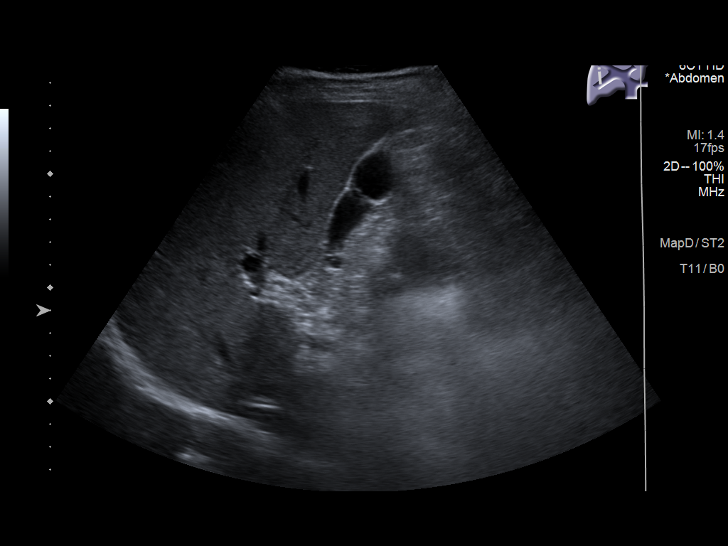
[im 34/46]
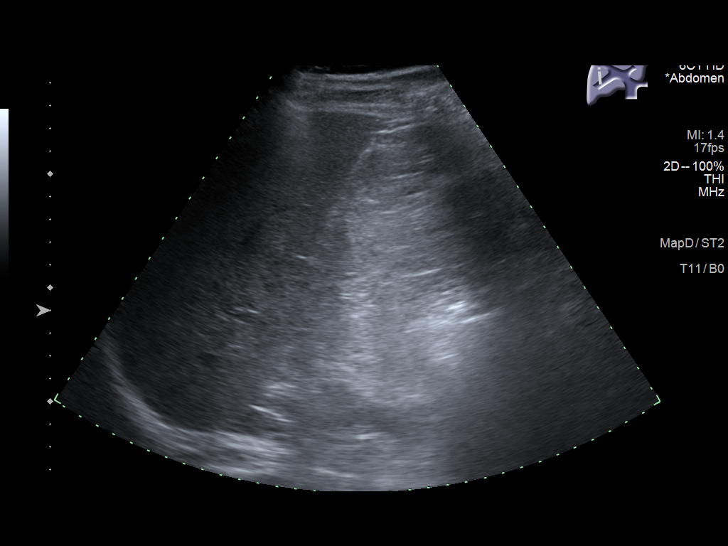
[im 38/46]
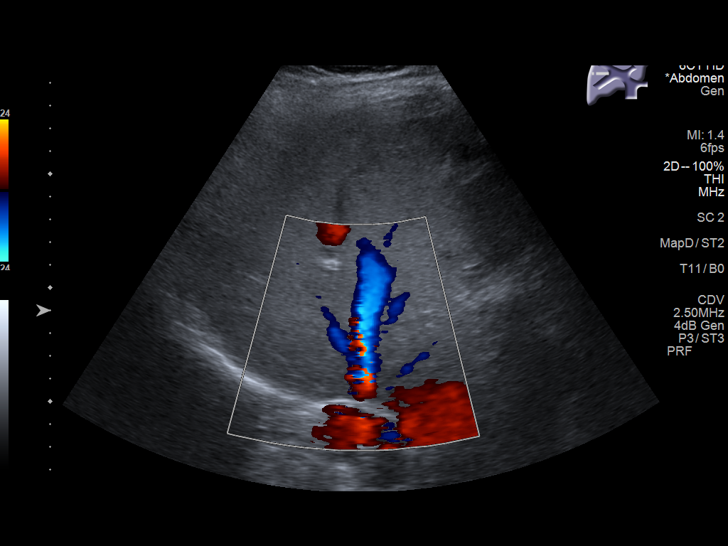
[im 42/46]
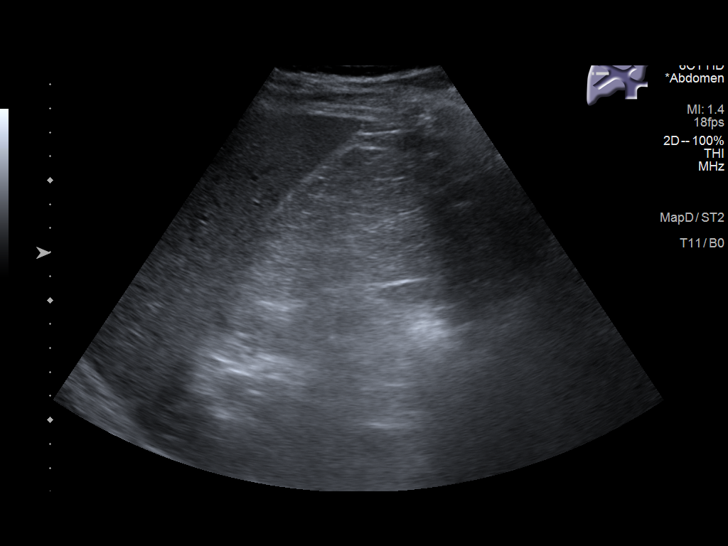
[im 46/46]
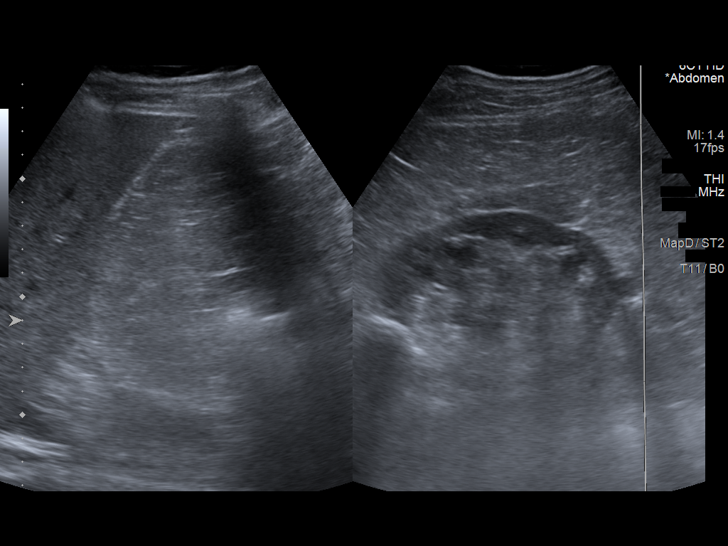

[14 of 25 positions shown; findings below may reference images not displayed]

FINDINGS: Gallbladder:

No gallstones or wall thickening visualized. No sonographic Murphy
sign noted by sonographer.

Common bile duct:

Diameter: 4 mm

Liver:

No focal lesion identified. Within normal limits in parenchymal
echogenicity. Portal vein is patent on color Doppler imaging with
normal direction of blood flow towards the liver.

Other: None.
IMPRESSION: Normal study.  No cause for pain identified.

## 2022-11-17 IMAGING — CR DG CHEST 2V
2 series · 2 of 2 positions shown · non-contrast
Comparison: Chest CT 12/31/2018

CLINICAL DATA: Preop kidney cyst

EXAM:
CHEST - 2 VIEW

[w chest pa]
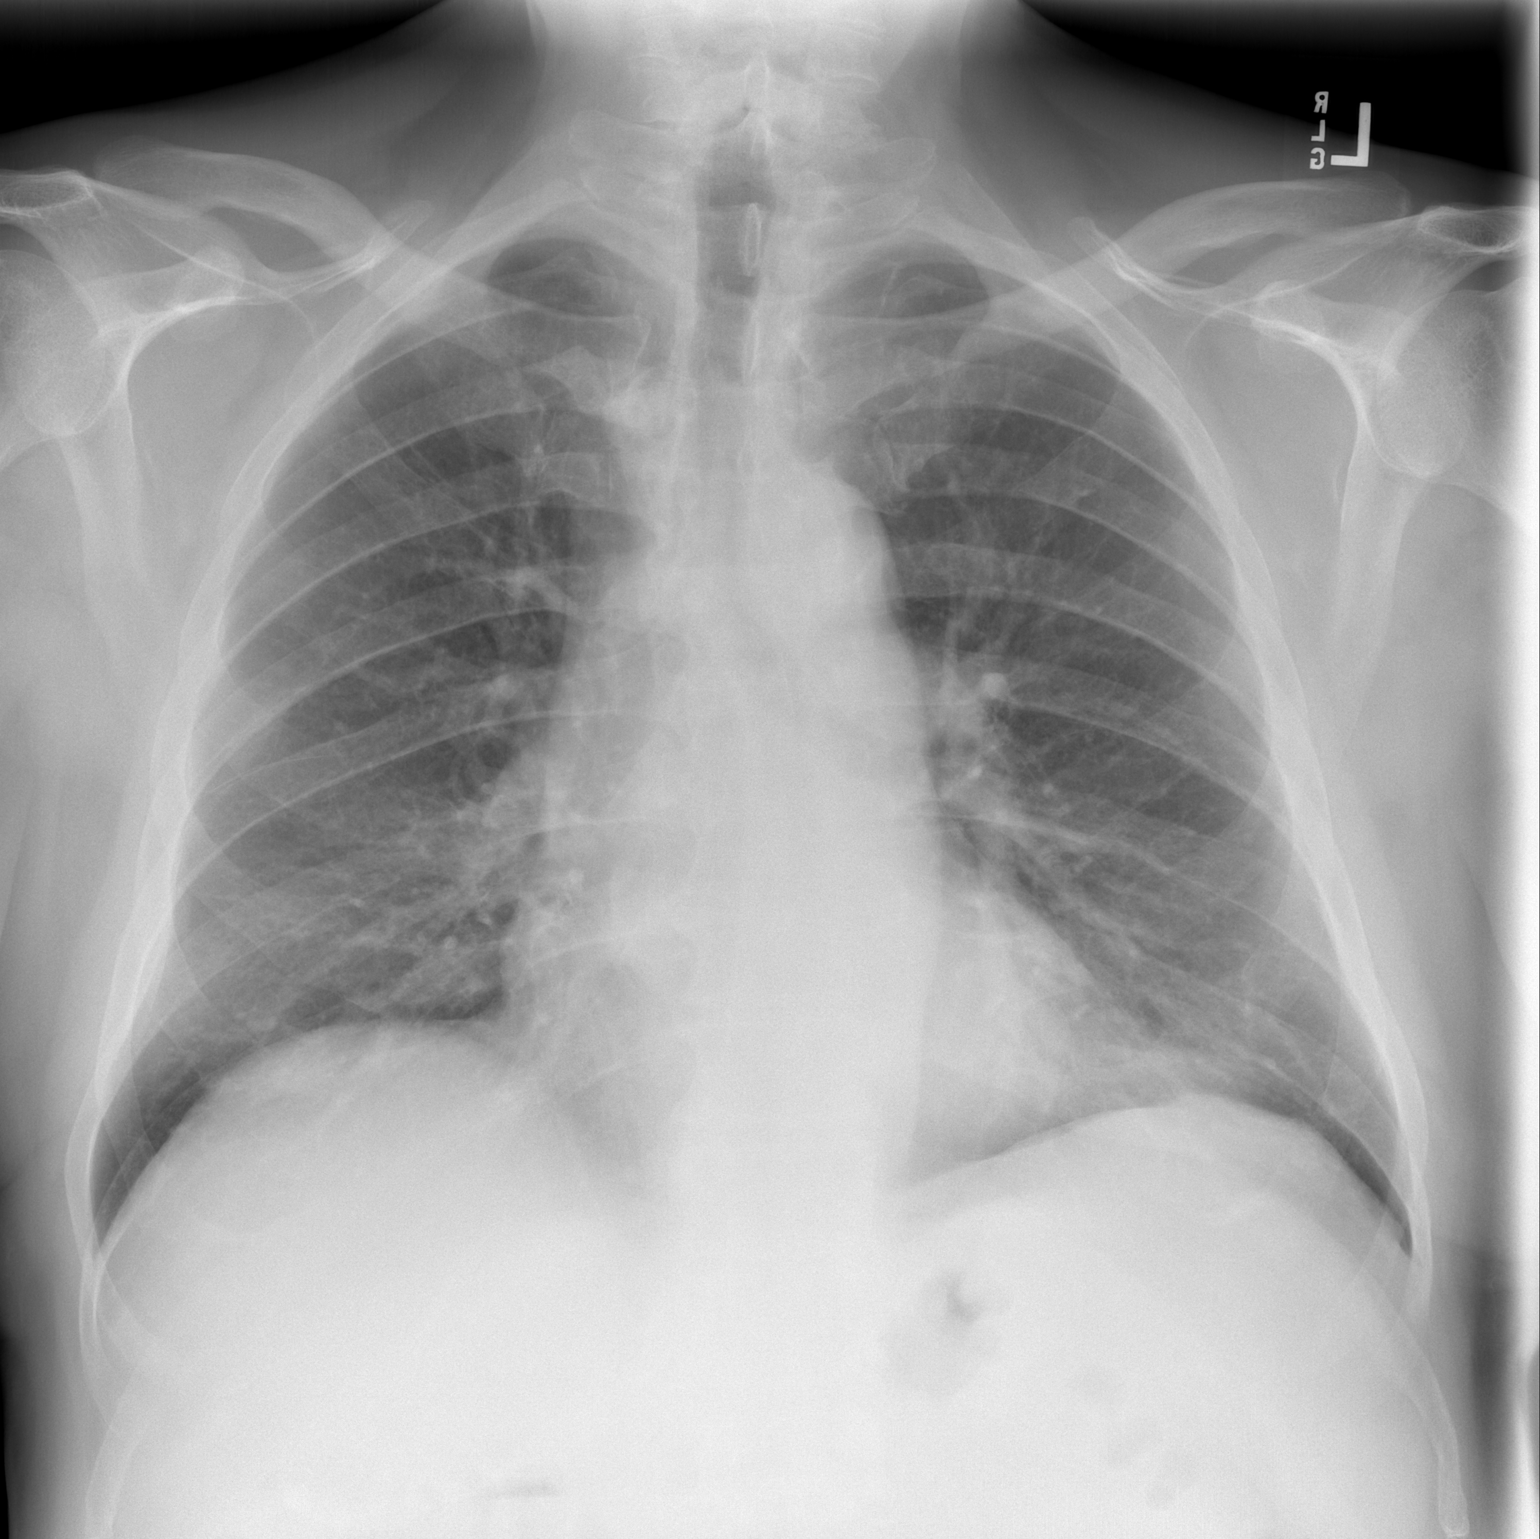

[w chest lat]
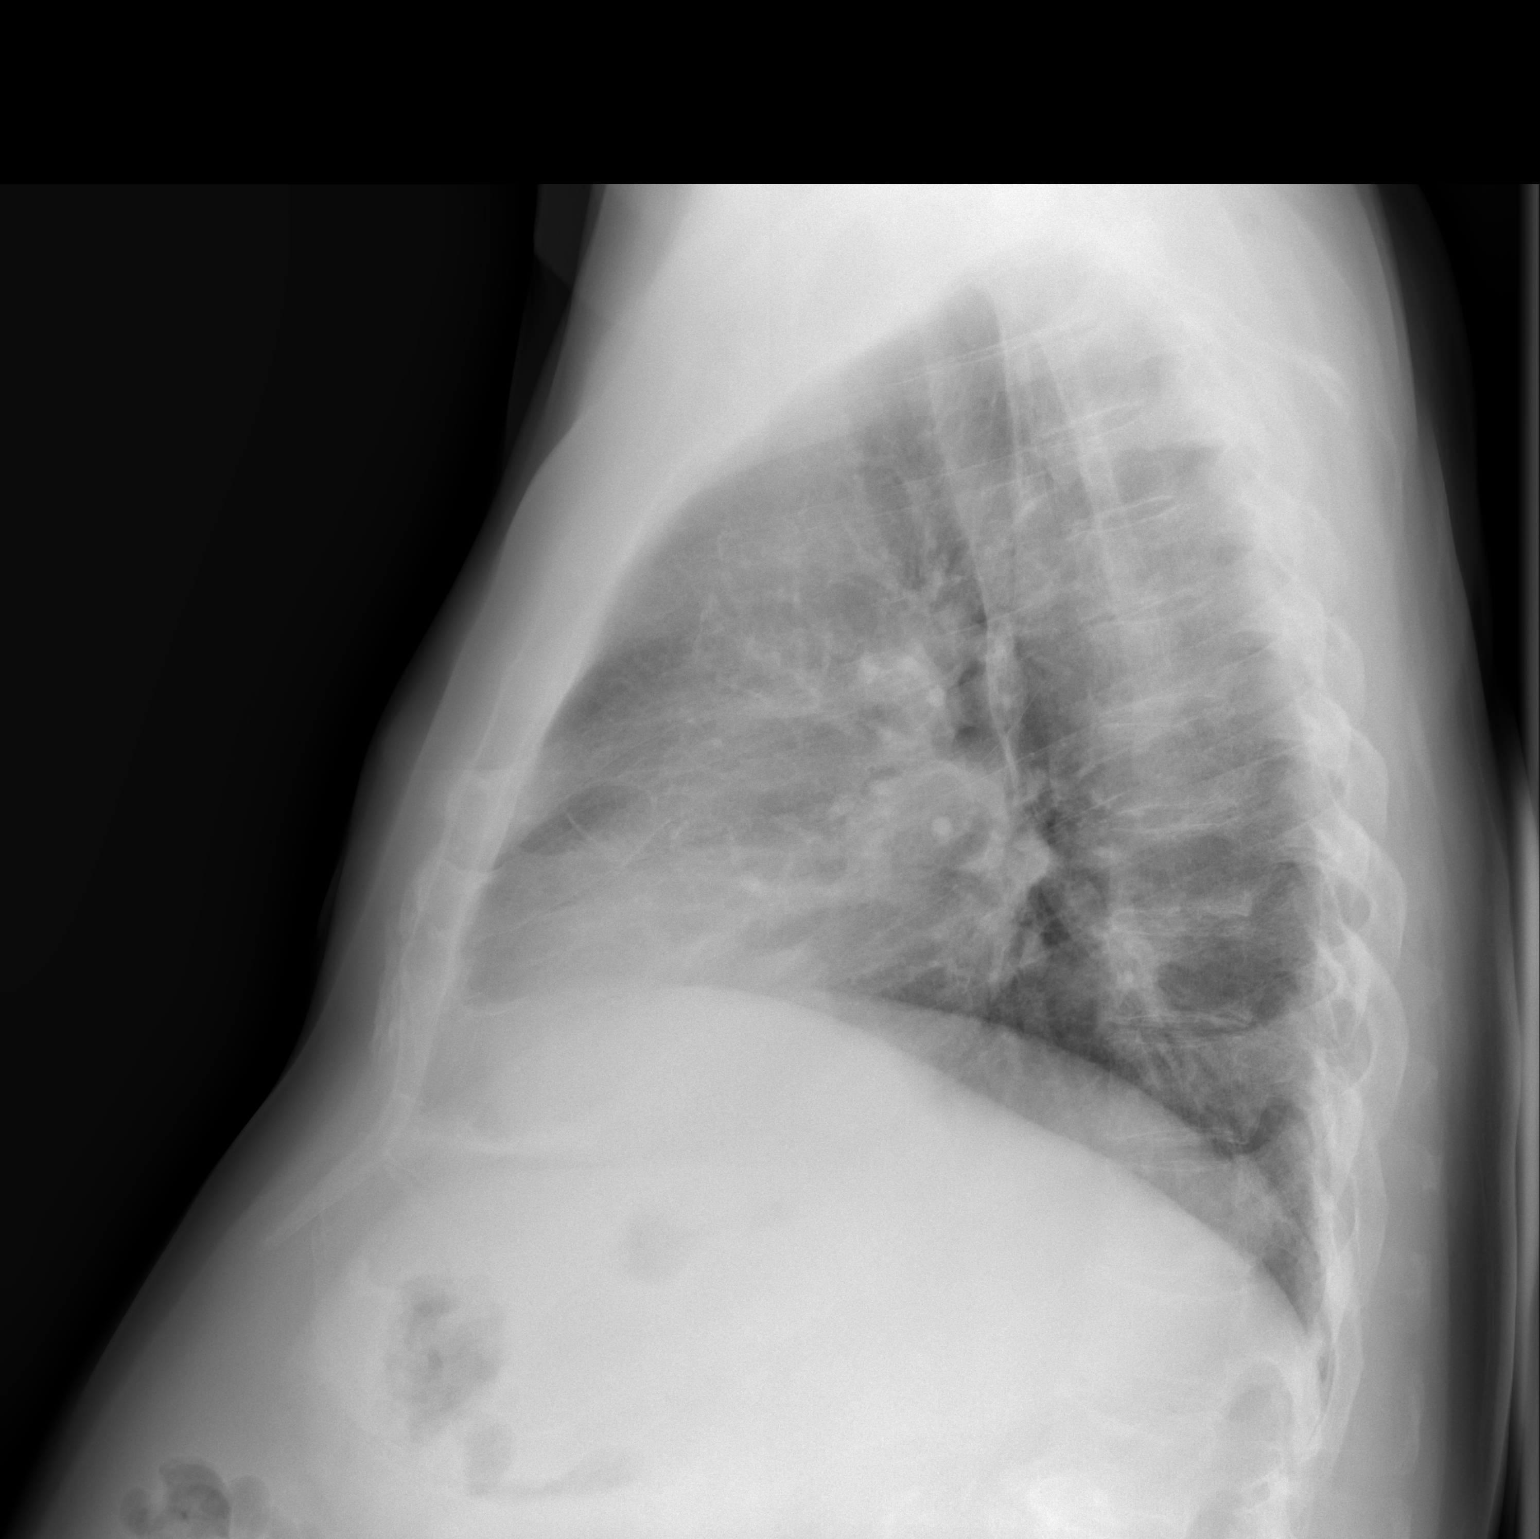

[2 of 2 positions shown; findings below may reference images not displayed]

FINDINGS: Minimal atelectasis left base. No consolidation or effusion. Normal
cardiac size. No pneumothorax
IMPRESSION: No active cardiopulmonary disease.

## 2023-02-20 ENCOUNTER — Emergency Department
Admission: EM | Admit: 2023-02-20 | Discharge: 2023-02-20 | Disposition: A | Discharge status: Home / Self Care | Payer: Medicare Other | Attending: Emergency Medicine | Admitting: Emergency Medicine

## 2023-02-20 ENCOUNTER — Emergency Department: Payer: Medicare Other

## 2023-02-20 ENCOUNTER — Other Ambulatory Visit: Payer: Self-pay

## 2023-02-20 DIAGNOSIS — S92415B Nondisplaced fracture of proximal phalanx of left great toe, initial encounter for open fracture: Secondary | ICD-10-CM | POA: Diagnosis not present

## 2023-02-20 DIAGNOSIS — Y92009 Unspecified place in unspecified non-institutional (private) residence as the place of occurrence of the external cause: Secondary | ICD-10-CM | POA: Insufficient documentation

## 2023-02-20 DIAGNOSIS — W010XXA Fall on same level from slipping, tripping and stumbling without subsequent striking against object, initial encounter: Secondary | ICD-10-CM | POA: Insufficient documentation

## 2023-02-20 DIAGNOSIS — S99922A Unspecified injury of left foot, initial encounter: Secondary | ICD-10-CM | POA: Diagnosis present

## 2023-02-20 DIAGNOSIS — M7989 Other specified soft tissue disorders: Secondary | ICD-10-CM | POA: Diagnosis not present

## 2023-02-20 MED ORDER — LIDOCAINE HCL (PF) 1 % IJ SOLN
5.0000 mL | Freq: Once | INTRAMUSCULAR | Status: AC
  Administered 2023-02-20: 5 mL
  Filled 2023-02-20: qty 5

## 2023-02-20 MED ORDER — CEPHALEXIN 500 MG PO CAPS: 500.0000 mg | ORAL_CAPSULE | Freq: Four times a day (QID) | ORAL | 0 refills | Status: AC

## 2023-02-20 MED ORDER — CEPHALEXIN 500 MG PO CAPS
500.0000 mg | ORAL_CAPSULE | Freq: Once | ORAL | Status: AC
  Administered 2023-02-20: 500 mg via ORAL
  Filled 2023-02-20: qty 1

## 2023-02-20 NOTE — ED Triage Notes (Signed)
Pt comes with left big toe pain that happened last night. Pt states he stumped it and it was bleeding. Pt states he bandaged it up but it has started bleeding again and is swollen.

## 2023-02-20 NOTE — ED Provider Notes (Cosign Needed)
[facility] Emergency Department Provider Note     Event Date/Time   First MD Initiated Contact with Patient 02/20/23 1206     (approximate)   History   Toe Injury   HPI  James Yang. is a 82 y.o. male presents to the ED for evaluation of left great toe injury. Patient reports last night around 1030 pm he tripped over a carpet at home and hyperflexed his great toe. Laceration noted. Denies numbness and decrease motion.     Physical Exam   Triage Vital Signs: ED Triage Vitals [02/20/23 1151]  Encounter Vitals Group     BP (!) 144/66     Systolic BP Percentile      Diastolic BP Percentile      Pulse Rate 64     Resp 18     Temp 98 F (36.7 C)     Temp src      SpO2 96 %     Weight 221 lb (100.2 kg)     Height 6' (1.829 m)     Head Circumference      Peak Flow      Pain Score 2     Pain Loc      Pain Education      Exclude from Growth Chart     Most recent vital signs: Vitals:   02/20/23 1151  BP: (!) 144/66  Pulse: 64  Resp: 18  Temp: 98 F (36.7 C)  SpO2: 96%    General Awake, no distress.  HEENT NCAT. PERRL. EOMI. No rhinorrhea. Mucous membranes are moist.  CV:  Good peripheral perfusion.  RESP:  Normal effort.  ABD:  No distention.  Other:  Left great toe reveals laceration approximately 4 cm over volar PIP joint.  Edema and skin discoloration noted. neurovascular status intact all throughout.  Full ROM with extension and lection.   ED Results / Procedures / Treatments   Labs (all labs ordered are listed, but only abnormal results are displayed) Labs Reviewed - No data to display  RADIOLOGY  I personally viewed and evaluated these images as part of my medical decision making, as well as reviewing the written report by the radiologist.  ED Provider Interpretation: Left toe appears normal.  No obvious bony abnormality.  Will confirm with final radiology read.  DG Toe Great Left Result Date: 02/20/2023 CLINICAL  DATA:  Left great toe pain, injured yesterday EXAM: LEFT GREAT TOE COMPARISON:  None Available. FINDINGS: Frontal, oblique, and lateral views of the left great toe are obtained. There is a minimally displaced intra-articular fracture through the dorsal aspect of the base of the first distal phalanx, with approximately 5 mm of distraction of the fracture fragments. No other acute bony abnormalities. Joint spaces are well preserved. Soft tissue swelling is seen throughout the first digit. IMPRESSION: 1. Minimally distracted intra-articular fracture at the dorsal margin of the base of the first distal phalanx. Electronically Signed   By: Sharlet Salina M.D.   On: 02/20/2023 12:37    PROCEDURES:  Critical Care performed: No  .Laceration Repair  Date/Time: 02/20/2023 3:35 PM  Performed by: Conrad Reed, PA-C Authorized by: Conrad [place], PA-C   Consent:    Consent obtained:  Verbal   Consent given by:  Patient   Risks discussed:  Infection, pain, need for additional repair and nerve damage Laceration details:    Location:  Toe   Toe location:  L big toe   Length (  cm):  4   Depth (mm):  1 Exploration:    Contaminated: no   Treatment:    Area cleansed with:  Chlorhexidine and saline   Amount of cleaning:  Standard   Irrigation solution:  Sterile saline   Irrigation method:  Pressure wash Skin repair:    Repair method:  Sutures   Suture size:  5-0   Suture material:  Nylon   Suture technique:  Simple interrupted   Number of sutures:  7 Approximation:    Approximation:  Close Repair type:    Repair type:  Simple Post-procedure details:    Dressing:  Splint for protection (buddy tape and post op shoe)   Procedure completion:  Tolerated    MEDICATIONS ORDERED IN ED: Medications  lidocaine (PF) (XYLOCAINE) 1 % injection 5 mL (5 mLs Infiltration Given by Other 02/20/23 1342)  cephALEXin (KEFLEX) capsule 500 mg (500 mg Oral Given 02/20/23 1413)     IMPRESSION / MDM /  ASSESSMENT AND PLAN / ED COURSE  I reviewed the triage vital signs and the nursing notes.                               82 y.o. male presents to the emergency department for evaluation and treatment of left great toe injury. See HPI for further details.   Differential diagnosis includes, but is not limited to laceration, fracture, dislocation  Patient's presentation is most consistent with acute complicated illness / injury requiring diagnostic workup.  Patient is alert and oriented.  He is hemodynamically stable and afebrile.  Exam findings are as stated above.  X-ray reveals a minimally distracted intra-articular fracture at the dorsal margin of the base of the first distal phalanx.  Given this and the laceration this is negative of an open fracture.  Sutures placed.  Please see procedure note.  Patient tolerated well.  Encourage patient a close follow-up with orthopedic for further evaluation.  Toes have been buddy taped and patient is placed in postop shoe.  He is provided with crutches.  He is nonweightbearing status until orthopedic evaluation.  Patient verbalized understanding.  ED Precautions discussed.  First antibiotic dose given in ED.   FINAL CLINICAL IMPRESSION(S) / ED DIAGNOSES   Final diagnoses:  Open nondisplaced fracture of proximal phalanx of left great toe, initial encounter    Rx / DC Orders   ED Discharge Orders          Ordered    cephALEXin (KEFLEX) 500 MG capsule  4 times daily        02/20/23 1401            Note:  This document was prepared using Dragon voice recognition software and may include unintentional dictation errors.    Romeo Apple, Clements Toro A, PA-C 02/20/23 1539

## 2023-02-20 NOTE — Discharge Instructions (Addendum)
Keep sutures dry for first 24 hrs. Keep suture site clean & dry. Gently use soap & water after first 24 hrs. DO NOT USE alcohol, hydrogen peroxide etc, to clean skin. You may cover the incision with clean gauze & replace it after your daily shower for your comfort. If you have skin tapes (Steristrips) or skin glue (Dermabond) on your incision, leave them in place. They will fall off on their own like a scab in a few weeks.  You may trim any edges that curl up with clean scissors.   Remain in postop shoe until follow-up appointment with orthopedic.  You will need your sutures removed in 7 to 10 days.  Nonweightbearing status with gradual weightbearing with the assistance of crutches until this appointment.  If you have been prescribed antibiotics, take them exactly as directed. Do not stop taking them because your symptoms have improved. If you don't finish the entire antibiotic course, you can breed resistant infections which are harder to treat. Take over-the-counter Tylenol or Ibuprofen with food or snacks as needed for pain.Take care and [employer] Christmas!

## 2023-09-03 ENCOUNTER — Inpatient Hospital Stay: Payer: Medicare Other | Attending: Oncology

## 2023-09-03 DIAGNOSIS — D509 Iron deficiency anemia, unspecified: Secondary | ICD-10-CM | POA: Insufficient documentation

## 2023-09-03 DIAGNOSIS — D75839 Thrombocytosis, unspecified: Secondary | ICD-10-CM | POA: Insufficient documentation

## 2023-09-03 DIAGNOSIS — Z87891 Personal history of nicotine dependence: Secondary | ICD-10-CM | POA: Insufficient documentation

## 2023-09-03 DIAGNOSIS — Z79899 Other long term (current) drug therapy: Secondary | ICD-10-CM | POA: Insufficient documentation

## 2023-09-03 DIAGNOSIS — Z1589 Genetic susceptibility to other disease: Secondary | ICD-10-CM | POA: Insufficient documentation

## 2023-09-05 ENCOUNTER — Inpatient Hospital Stay: Payer: Medicare Other | Admitting: Oncology

## 2023-09-07 ENCOUNTER — Other Ambulatory Visit: Payer: Self-pay

## 2023-09-07 DIAGNOSIS — D508 Other iron deficiency anemias: Secondary | ICD-10-CM

## 2023-09-10 ENCOUNTER — Inpatient Hospital Stay

## 2023-09-10 DIAGNOSIS — D508 Other iron deficiency anemias: Secondary | ICD-10-CM

## 2023-09-10 DIAGNOSIS — D509 Iron deficiency anemia, unspecified: Secondary | ICD-10-CM | POA: Diagnosis present

## 2023-09-10 DIAGNOSIS — Z79899 Other long term (current) drug therapy: Secondary | ICD-10-CM | POA: Diagnosis not present

## 2023-09-10 DIAGNOSIS — Z1589 Genetic susceptibility to other disease: Secondary | ICD-10-CM | POA: Diagnosis not present

## 2023-09-10 DIAGNOSIS — Z87891 Personal history of nicotine dependence: Secondary | ICD-10-CM | POA: Diagnosis not present

## 2023-09-10 DIAGNOSIS — D75839 Thrombocytosis, unspecified: Secondary | ICD-10-CM | POA: Diagnosis not present

## 2023-09-10 LAB — CBC WITH DIFFERENTIAL (CANCER CENTER ONLY)
Abs Immature Granulocytes: 0.1 K/uL — ABNORMAL HIGH (ref 0.00–0.07)
Basophils Absolute: 0.1 K/uL (ref 0.0–0.1)
Basophils Relative: 1 %
Eosinophils Absolute: 0.1 K/uL (ref 0.0–0.5)
Eosinophils Relative: 2 %
HCT: 52.6 % — ABNORMAL HIGH (ref 39.0–52.0)
Hemoglobin: 17 g/dL (ref 13.0–17.0)
Immature Granulocytes: 1 %
Lymphocytes Relative: 13 %
Lymphs Abs: 1.1 K/uL (ref 0.7–4.0)
MCH: 29.1 pg (ref 26.0–34.0)
MCHC: 32.3 g/dL (ref 30.0–36.0)
MCV: 89.9 fL (ref 80.0–100.0)
Monocytes Absolute: 0.5 K/uL (ref 0.1–1.0)
Monocytes Relative: 6 %
Neutro Abs: 6.7 K/uL (ref 1.7–7.7)
Neutrophils Relative %: 77 %
Platelet Count: 419 K/uL — ABNORMAL HIGH (ref 150–400)
RBC: 5.85 MIL/uL — ABNORMAL HIGH (ref 4.22–5.81)
RDW: 15 % (ref 11.5–15.5)
WBC Count: 8.6 K/uL (ref 4.0–10.5)
nRBC: 0 % (ref 0.0–0.2)

## 2023-09-10 LAB — RETIC PANEL
Immature Retic Fract: 12.8 % (ref 2.3–15.9)
RBC.: 5.85 MIL/uL — ABNORMAL HIGH (ref 4.22–5.81)
Retic Count, Absolute: 89.5 K/uL (ref 19.0–186.0)
Retic Ct Pct: 1.5 % (ref 0.4–3.1)
Reticulocyte Hemoglobin: 31.5 pg (ref >27.9)

## 2023-09-10 LAB — FERRITIN: Ferritin: 39 ng/mL (ref 24–336)

## 2023-09-10 LAB — IRON AND TIBC
Iron: 102 ug/dL (ref 45–182)
Saturation Ratios: 30 % (ref 17.9–39.5)
TIBC: 343 ug/dL (ref 250–450)
UIBC: 241 ug/dL

## 2023-09-17 ENCOUNTER — Encounter: Payer: Self-pay | Admitting: Oncology

## 2023-09-17 ENCOUNTER — Inpatient Hospital Stay: Admitting: Oncology

## 2023-09-17 ENCOUNTER — Inpatient Hospital Stay

## 2023-09-17 VITALS — BP 133/67 | HR 62 | Temp 98.9°F | Resp 17 | Wt 226.5 lb

## 2023-09-17 DIAGNOSIS — D75839 Thrombocytosis, unspecified: Secondary | ICD-10-CM | POA: Diagnosis not present

## 2023-09-17 DIAGNOSIS — D508 Other iron deficiency anemias: Secondary | ICD-10-CM | POA: Diagnosis not present

## 2023-09-17 DIAGNOSIS — D509 Iron deficiency anemia, unspecified: Secondary | ICD-10-CM | POA: Diagnosis not present

## 2023-09-17 NOTE — Progress Notes (Addendum)
 Hematology/Oncology Progress note [phone] [phone]        REFERRING PROVIDER: Glover Lenis, MD CHIEF COMPLAINTS/REASON FOR VISIT:  Anemia  ASSESSMENT & PLAN:  IDA (iron  deficiency anemia) Labs are reviewed and discussed with patient. Lab Results  Component Value Date   HGB 17.0 09/10/2023   TIBC 343 09/10/2023   IRONPCTSAT 30 09/10/2023   FERRITIN 39 09/10/2023     No need for IV Venofer   Stop oral iron  supplementation   Thrombocytosis Persistent thrombocytosis despite resolutiton of IDA.  Check Jak2 mutation with reflex and BCR-ABL1 FISH  Orders Placed This Encounter  Procedures   BCR-ABL1 FISH    Standing Status:   Future    Number of Occurrences:   1    Expected Date:   09/17/2023    Expiration Date:   09/16/2024   Carbon monoxide, blood (performed at ref lab)    Standing Status:   Future    Number of Occurrences:   1    Expected Date:   09/18/2023    Expiration Date:   09/16/2024   Erythropoietin     Standing Status:   Future    Number of Occurrences:   1    Expected Date:   09/17/2023    Expiration Date:   09/16/2024   JAK2 V617F rfx CALR/MPL/E12-15    Standing Status:   Future    Number of Occurrences:   1    Expected Date:   09/17/2023    Expiration Date:   09/16/2024   Follow up PRN All questions were answered. The patient knows to call the clinic with any problems, questions or concerns.  Zelphia Cap, MD, PhD [facility] Hematology Oncology 09/17/2023     HISTORY OF PRESENTING ILLNESS:  James Minix. is a  83 y.o.  male with PMH listed below who was referred to me for anemia Reviewed patient's recent labs that was done.  He was found to have abnormal CBC on 03/07/2022 cbc showed hb 13, mcv 74, platelet 557 Iron  saturation 11, ferritin 3 Some fatigue.  He denies recent chest pain on exertion, shortness of breath on minimal exertion, pre-syncopal episodes, or palpitations He had not noticed any recent bleeding such as  epistaxis, hematuria or hematochezia.  He denies over the counter NSAID ingestion. Cologuard was 5 years ago  he has established care with [facility]  Last colonoscopy, findings include diverticulosis in sigmoid colon, interval hemorrhoids.  INTERVAL HISTORY James Pianka. is a 83 y.o. male who has above history reviewed by me today presents for follow up visit for IDA. thrombocytosis   MEDICAL HISTORY:  Past Medical History:  Diagnosis Date   Arthritis    lower back and both ankles   Chronic kidney disease    Kidney stones   Diabetes mellitus without complication (HCC)    Diverticulosis    GERD (gastroesophageal reflux disease)    Hyperlipidemia    Hypertension     SURGICAL HISTORY: Past Surgical History:  Procedure Laterality Date   COLONOSCOPY WITH PROPOFOL  N/A 05/30/2022   Procedure: COLONOSCOPY WITH PROPOFOL ;  Surgeon: Maryruth Ole DASEN, MD;  Location: [facility] ENDOSCOPY;  Service: Endoscopy;  Laterality: N/A;   ESOPHAGEAL MANOMETRY N/A 11/06/2018   Procedure: ESOPHAGEAL MANOMETRY (EM);  Surgeon: Shila Gustav GAILS, MD;  Location: [facility] ENDOSCOPY;  Service: Endoscopy;  Laterality: N/A;   ESOPHAGOGASTRODUODENOSCOPY N/A 05/30/2022   Procedure: ESOPHAGOGASTRODUODENOSCOPY (EGD);  Surgeon: Maryruth Ole DASEN, MD;  Location: [facility] ENDOSCOPY;  Service: Endoscopy;  Laterality: N/A;  KIDNEY STONE SURGERY Right 1983   Dr. Patrina   ROBOTIC ASSITED PARTIAL NEPHRECTOMY Left 09/02/2021   Procedure: XI ROBOTIC ASSITED PARTIAL NEPHRECTOM, EXCISION OF PERINEPHRIC FAT;  Surgeon: Selma Donnice SAUNDERS, MD;  Location: [facility] ORS;  Service: Urology;  Laterality: Left;   TONSILLECTOMY     UMBILICAL HERNIA REPAIR N/A 03/11/2015   Procedure: HERNIA REPAIR UMBILICAL ADULT;  Surgeon: Larinda Unknown Sharps, MD;  Location: [facility] ORS;  Service: General;  Laterality: N/A;    SOCIAL HISTORY: Social History   Socioeconomic History   Marital status: Married    Spouse name: Not on file   Number of children: Not on file    Years of education: Not on file   Highest education level: Not on file  Occupational History   Not on file  Tobacco Use   Smoking status: Former    Current packs/day: 0.00    Average packs/day: 1 pack/day for 30.0 years (30.0 ttl pk-yrs)    Types: Cigarettes    Start date: 05/28/1972    Quit date: 05/29/2002    Years since quitting: 21.3   Smokeless tobacco: Never  Vaping Use   Vaping status: Never Used  Substance and Sexual Activity   Alcohol use: Yes    Comment: occasional   Drug use: No   Sexual activity: Not on file  Other Topics Concern   Not on file  Social History Narrative   Not on file   Social Drivers of Health   Financial Resource Strain: Low Risk  (10/23/2022)   Received from [facility]   Overall Financial Resource Strain (CARDIA)    Difficulty of Paying Living Expenses: Not hard at all  Food Insecurity: No Food Insecurity (10/23/2022)   Received from [facility]   Hunger Vital Sign    Within the past 12 months, you worried that your food would run out before you got the money to buy more.: Never true    Within the past 12 months, the food you bought just didn't last and you didn't have money to get more.: Never true  Transportation Needs: No Transportation Needs (10/23/2022)   Received from [facility] - Transportation    In the past 12 months, has lack of transportation kept you from medical appointments or from getting medications?: No    Lack of Transportation (Non-Medical): No  Physical Activity: Insufficiently Active (03/31/2022)   Exercise Vital Sign    Days of Exercise per Week: 1 day    Minutes of Exercise per Session: 20 min  Stress: No Stress Concern Present (03/31/2022)   [employer] of Occupational Health - Occupational Stress Questionnaire    Feeling of Stress : Not at all  Social Connections: Socially Integrated (03/31/2022)   Social Connection and Isolation Panel    Frequency of  Communication with Friends and Family: Three times a week    Frequency of Social Gatherings with Friends and Family: Once a week    Attends Religious Services: More than 4 times per year    Active Member of [employer] or Organizations: Yes    Attends Banker Meetings: 1 to 4 times per year    Marital Status: Married  Catering manager Violence: Not At Risk (03/31/2022)   Humiliation, Afraid, Rape, and Kick questionnaire    Fear of Current or Ex-Partner: No    Emotionally Abused: No    Physically Abused: No    Sexually Abused: No    FAMILY  HISTORY: Family History  Problem Relation Age of Onset   Heart disease Mother    Heart disease Other     ALLERGIES:  has no known allergies.  MEDICATIONS:  Current Outpatient Medications  Medication Sig Dispense Refill   aspirin (ASPIRIN ADULT LOW DOSE) 81 MG EC tablet Take 81 mg by mouth daily. Swallow whole.     atorvastatin  (LIPITOR) 20 MG tablet Take 20 mg by mouth at bedtime.     Cyanocobalamin (B-12 PO) Take 1,000 mcg by mouth daily.     DiphenhydrAMINE  HCl, Sleep, 50 MG CAPS Take 100 mg by mouth at bedtime.     docusate sodium  (COLACE) 100 MG capsule Take 1 capsule by mouth daily as needed for up to 30 doses. 30 capsule 0   hydrochlorothiazide (HYDRODIURIL) 12.5 MG tablet Take 12.5 mg by mouth daily.     ibuprofen (ADVIL) 200 MG tablet Take 400 mg by mouth every 6 (six) hours as needed for moderate pain.     ipratropium (ATROVENT ) 0.03 % nasal spray Place 2 sprays into both nostrils 3 (three) times daily before meals.     Iron -Vitamin C (VITRON-C) 65-125 MG TABS Take by mouth.     metFORMIN (GLUCOPHAGE-XR) 500 MG 24 hr tablet Take 500 mg by mouth 2 (two) times daily.     metoprolol  succinate (TOPROL -XL) 25 MG 24 hr tablet Take 25 mg by mouth in the morning and at bedtime.     Multiple Vitamins-Minerals (PRESERVISION AREDS 2 PO) Take 1 tablet by mouth in the morning and at bedtime.     Omega-3 Fatty Acids (FISH OIL) 1200 MG CAPS Take  2,400 mg by mouth daily.     omeprazole (PRILOSEC) 20 MG capsule Take 20 mg by mouth at bedtime.     silodosin (RAPAFLO) 8 MG CAPS capsule Take 8 mg by mouth at bedtime.     HYDROcodone -acetaminophen  (NORCO) 5-325 MG tablet Take 1-2 tablets by mouth every 4 (four) hours as needed for moderate pain. (Patient not taking: Reported on 09/17/2023) 12 tablet 0   oxyCODONE -acetaminophen  (PERCOCET) 5-325 MG tablet Take 1 tablet by mouth every 4 hours as needed for severe pain. (Patient not taking: Reported on 09/17/2023) 20 tablet 0   No current facility-administered medications for this visit.    Review of Systems  Constitutional:  Positive for fatigue. Negative for appetite change, chills, fever and unexpected weight change.  HENT:   Negative for hearing loss and voice change.   Eyes:  Negative for eye problems and icterus.  Respiratory:  Negative for chest tightness, cough and shortness of breath.   Cardiovascular:  Negative for chest pain and leg swelling.  Gastrointestinal:  Negative for abdominal distention and abdominal pain.  Endocrine: Negative for hot flashes.  Genitourinary:  Negative for difficulty urinating, dysuria and frequency.   Musculoskeletal:  Negative for arthralgias.  Skin:  Negative for itching and rash.  Neurological:  Negative for light-headedness and numbness.  Hematological:  Negative for adenopathy. Does not bruise/bleed easily.  Psychiatric/Behavioral:  Negative for confusion.     PHYSICAL EXAMINATION: ECOG PERFORMANCE STATUS: 0 - Asymptomatic Vitals:   09/17/23 1356  BP: 133/67  Pulse: 62  Resp: 17  Temp: 98.9 F (37.2 C)  SpO2: 95%   Filed Weights   09/17/23 1356  Weight: 226 lb 8 oz (102.7 kg)    Physical Exam Constitutional:      General: He is not in acute distress. HENT:     Head: Normocephalic and atraumatic.  Eyes:  General: No scleral icterus. Cardiovascular:     Rate and Rhythm: Normal rate.  Pulmonary:     Effort: Pulmonary effort is  normal. No respiratory distress.  Abdominal:     General: Bowel sounds are normal. There is no distension.     Palpations: Abdomen is soft.  Musculoskeletal:        General: Normal range of motion.     Cervical back: Normal range of motion and neck supple.  Skin:    Findings: No erythema or rash.  Neurological:     Mental Status: He is alert and oriented to person, place, and time. Mental status is at baseline.  Psychiatric:        Mood and Affect: Mood normal.      LABORATORY DATA:  I have reviewed the data as listed    Latest Ref Rng & Units 09/10/2023    9:32 AM 09/01/2022   12:55 PM 09/03/2021    5:58 AM  CBC  WBC 4.0 - 10.5 K/uL 8.6  8.0    Hemoglobin 13.0 - 17.0 g/dL 82.9  83.3  88.1   Hematocrit 39.0 - 52.0 % 52.6  52.8  39.5   Platelets 150 - 400 K/uL 419  427        Latest Ref Rng & Units 09/01/2022   12:55 PM 09/03/2021    5:58 AM 12/31/2018    9:31 AM  CMP  Glucose 70 - 99 mg/dL 879  865    BUN 8 - 23 mg/dL 16  17    Creatinine 9.38 - 1.24 mg/dL 8.86  8.66  8.69   Sodium 135 - 145 mmol/L 137  138    Potassium 3.5 - 5.1 mmol/L 4.3  4.7    Chloride 98 - 111 mmol/L 105  105    CO2 22 - 32 mmol/L 24  27    Calcium  8.9 - 10.3 mg/dL 9.1  8.6    Total Protein 6.5 - 8.1 g/dL 6.7     Total Bilirubin 0.3 - 1.2 mg/dL 0.6     Alkaline Phos 38 - 126 U/L 65     AST 15 - 41 U/L 28     ALT 0 - 44 U/L 22         Component Value Date/Time   IRON  102 09/10/2023 0932   TIBC 343 09/10/2023 0932   FERRITIN 39 09/10/2023 0932   IRONPCTSAT 30 09/10/2023 0932     RADIOGRAPHIC STUDIES: I have personally reviewed the radiological images as listed and agreed with the findings in the report. No results found.

## 2023-09-17 NOTE — Assessment & Plan Note (Addendum)
 Labs are reviewed and discussed with patient. Lab Results  Component Value Date   HGB 17.0 09/10/2023   TIBC 343 09/10/2023   IRONPCTSAT 30 09/10/2023   FERRITIN 39 09/10/2023     No need for IV Venofer   Stop oral iron  supplementation

## 2023-09-17 NOTE — Assessment & Plan Note (Signed)
 Persistent thrombocytosis despite resolutiton of IDA.  Check Jak2 mutation with reflex and BCR-ABL1 FISH

## 2023-09-18 LAB — CARBON MONOXIDE, BLOOD (PERFORMED AT REF LAB): Carbon Monoxide, Blood: 2.1 % (ref 0.0–3.6)

## 2023-09-18 LAB — ERYTHROPOIETIN: Erythropoietin: 4 m[IU]/mL (ref 2.6–18.5)

## 2023-09-24 LAB — JAK2 V617F RFX CALR/MPL/E12-15: JAK2 V617F %: 19.61 %

## 2023-09-24 LAB — BCR-ABL1 FISH
Cells Analyzed: 200
Cells Counted: 200

## 2023-09-27 ENCOUNTER — Ambulatory Visit: Payer: Self-pay | Admitting: Oncology

## 2023-10-04 ENCOUNTER — Inpatient Hospital Stay: Attending: Oncology | Admitting: Oncology

## 2023-10-04 ENCOUNTER — Telehealth: Payer: Self-pay

## 2023-10-04 ENCOUNTER — Encounter: Payer: Self-pay | Admitting: Oncology

## 2023-10-04 VITALS — BP 135/66 | HR 56 | Temp 96.7°F | Resp 18 | Wt 226.7 lb

## 2023-10-04 DIAGNOSIS — Z87442 Personal history of urinary calculi: Secondary | ICD-10-CM | POA: Diagnosis not present

## 2023-10-04 DIAGNOSIS — K5730 Diverticulosis of large intestine without perforation or abscess without bleeding: Secondary | ICD-10-CM | POA: Diagnosis not present

## 2023-10-04 DIAGNOSIS — K649 Unspecified hemorrhoids: Secondary | ICD-10-CM | POA: Insufficient documentation

## 2023-10-04 DIAGNOSIS — Z1589 Genetic susceptibility to other disease: Secondary | ICD-10-CM | POA: Diagnosis not present

## 2023-10-04 DIAGNOSIS — K219 Gastro-esophageal reflux disease without esophagitis: Secondary | ICD-10-CM | POA: Insufficient documentation

## 2023-10-04 DIAGNOSIS — Z7984 Long term (current) use of oral hypoglycemic drugs: Secondary | ICD-10-CM | POA: Diagnosis not present

## 2023-10-04 DIAGNOSIS — N189 Chronic kidney disease, unspecified: Secondary | ICD-10-CM | POA: Diagnosis not present

## 2023-10-04 DIAGNOSIS — D75839 Thrombocytosis, unspecified: Secondary | ICD-10-CM | POA: Diagnosis not present

## 2023-10-04 DIAGNOSIS — E785 Hyperlipidemia, unspecified: Secondary | ICD-10-CM | POA: Insufficient documentation

## 2023-10-04 DIAGNOSIS — D508 Other iron deficiency anemias: Secondary | ICD-10-CM

## 2023-10-04 DIAGNOSIS — Z87891 Personal history of nicotine dependence: Secondary | ICD-10-CM | POA: Insufficient documentation

## 2023-10-04 DIAGNOSIS — I10 Essential (primary) hypertension: Secondary | ICD-10-CM | POA: Diagnosis not present

## 2023-10-04 DIAGNOSIS — Z79899 Other long term (current) drug therapy: Secondary | ICD-10-CM | POA: Diagnosis not present

## 2023-10-04 DIAGNOSIS — Z7982 Long term (current) use of aspirin: Secondary | ICD-10-CM | POA: Insufficient documentation

## 2023-10-04 DIAGNOSIS — D509 Iron deficiency anemia, unspecified: Secondary | ICD-10-CM | POA: Diagnosis present

## 2023-10-04 DIAGNOSIS — R5383 Other fatigue: Secondary | ICD-10-CM | POA: Diagnosis not present

## 2023-10-04 DIAGNOSIS — D45 Polycythemia vera: Secondary | ICD-10-CM | POA: Insufficient documentation

## 2023-10-04 DIAGNOSIS — E119 Type 2 diabetes mellitus without complications: Secondary | ICD-10-CM | POA: Diagnosis not present

## 2023-10-04 NOTE — Assessment & Plan Note (Signed)
 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 ET versus PV I recommend bone marrow biopsy for further evaluation.

## 2023-10-04 NOTE — Progress Notes (Signed)
 Hematology/Oncology Progress note [phone] [phone]        REFERRING PROVIDER: Glover Lenis, MD CHIEF COMPLAINTS/REASON FOR VISIT:  Anemia  ASSESSMENT & PLAN:  IDA (iron  deficiency anemia) Labs are reviewed and discussed with patient. Lab Results  Component Value Date   HGB 17.0 09/10/2023   TIBC 343 09/10/2023   IRONPCTSAT 30 09/10/2023   FERRITIN 39 09/10/2023     No need for IV Venofer   Stop oral iron  supplementation   Thrombocytosis Persistent thrombocytosis despite resolutiton of IDA.  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 ET versus PV I recommend bone marrow biopsy for further evaluation.  Orders Placed This Encounter  Procedures   IR BONE MARROW BIOPSY & ASPIRATION    Standing Status:   Future    Expected Date:   10/11/2023    Expiration Date:   10/03/2024    Reason for Exam (SYMPTOM  OR DIAGNOSIS REQUIRED):   thrombocytosis    Preferred [facility]?:    [facility]   Follow up a few weeks after bone marrow biopsy to go over results. All questions were answered. The patient knows to call the clinic with any problems, questions or concerns.  Zelphia Cap, MD, PhD [facility] Hematology Oncology 10/04/2023     HISTORY OF PRESENTING ILLNESS:  James Otterson. is a  83 y.o.  male with PMH listed below who was referred to me for anemia Reviewed patient's recent labs that was done.  He was found to have abnormal CBC on 03/07/2022 cbc showed hb 13, mcv 74, platelet 557 Iron  saturation 11, ferritin 3 Some fatigue.  He denies recent chest pain on exertion, shortness of breath on minimal exertion, pre-syncopal episodes, or palpitations He had not noticed any recent bleeding such as epistaxis, hematuria or hematochezia.  He denies over the counter NSAID ingestion. Cologuard was 5 years ago  he has established care with [facility]  Last colonoscopy, findings  include diverticulosis in sigmoid colon, interval hemorrhoids.  INTERVAL HISTORY James Hicks. is a 83 y.o. male who has above history reviewed by me today presents for follow up visit for discussion of lab results. Patient was found to have JAK2 V6 33F mutation.  Patient presents to further discuss the results and management plan.  No new complaints.   MEDICAL HISTORY:  Past Medical History:  Diagnosis Date   Arthritis    lower back and both ankles   Chronic kidney disease    Kidney stones   Diabetes mellitus without complication (HCC)    Diverticulosis    GERD (gastroesophageal reflux disease)    Hyperlipidemia    Hypertension     SURGICAL HISTORY: Past Surgical History:  Procedure Laterality Date   COLONOSCOPY WITH PROPOFOL  N/A 05/30/2022   Procedure: COLONOSCOPY WITH PROPOFOL ;  Surgeon: Maryruth Ole DASEN, MD;  Location: [facility] ENDOSCOPY;  Service: Endoscopy;  Laterality: N/A;   ESOPHAGEAL MANOMETRY N/A 11/06/2018   Procedure: ESOPHAGEAL MANOMETRY (EM);  Surgeon: Shila Gustav GAILS, MD;  Location: [facility] ENDOSCOPY;  Service: Endoscopy;  Laterality: N/A;   ESOPHAGOGASTRODUODENOSCOPY N/A 05/30/2022   Procedure: ESOPHAGOGASTRODUODENOSCOPY (EGD);  Surgeon: Maryruth Ole DASEN, MD;  Location: [facility] ENDOSCOPY;  Service: Endoscopy;  Laterality: N/A;   KIDNEY STONE SURGERY Right 1983   Dr. Patrina   ROBOTIC ASSITED PARTIAL NEPHRECTOMY Left 09/02/2021   Procedure: XI ROBOTIC ASSITED PARTIAL NEPHRECTOM, EXCISION OF PERINEPHRIC FAT;  Surgeon: Selma Donnice SAUNDERS, MD;  Location: [facility] ORS;  Service: Urology;  Laterality: Left;   TONSILLECTOMY     UMBILICAL HERNIA REPAIR N/A 03/11/2015   Procedure: HERNIA REPAIR UMBILICAL ADULT;  Surgeon: Larinda Unknown Sharps, MD;  Location: [facility] ORS;  Service: General;  Laterality: N/A;    SOCIAL HISTORY: Social History   Socioeconomic History   Marital status: Married    Spouse name: Not on file   Number of children: Not on file   Years of education: Not on file    Highest education level: Not on file  Occupational History   Not on file  Tobacco Use   Smoking status: Former    Current packs/day: 0.00    Average packs/day: 1 pack/day for 30.0 years (30.0 ttl pk-yrs)    Types: Cigarettes    Start date: 05/28/1972    Quit date: 05/29/2002    Years since quitting: 21.3   Smokeless tobacco: Never  Vaping Use   Vaping status: Never Used  Substance and Sexual Activity   Alcohol use: Yes    Comment: occasional   Drug use: No   Sexual activity: Not on file  Other Topics Concern   Not on file  Social History Narrative   Not on file   Social Drivers of Health   Financial Resource Strain: Low Risk  (10/23/2022)   Received from [facility]   Overall Financial Resource Strain (CARDIA)    Difficulty of Paying Living Expenses: Not hard at all  Food Insecurity: No Food Insecurity (10/23/2022)   Received from [facility]   Hunger Vital Sign    Within the past 12 months, you worried that your food would run out before you got the money to buy more.: Never true    Within the past 12 months, the food you bought just didn't last and you didn't have money to get more.: Never true  Transportation Needs: No Transportation Needs (10/23/2022)   Received from [facility] - Transportation    In the past 12 months, has lack of transportation kept you from medical appointments or from getting medications?: No    Lack of Transportation (Non-Medical): No  Physical Activity: Insufficiently Active (03/31/2022)   Exercise Vital Sign    Days of Exercise per Week: 1 day    Minutes of Exercise per Session: 20 min  Stress: No Stress Concern Present (03/31/2022)   [employer] of Occupational Health - Occupational Stress Questionnaire    Feeling of Stress : Not at all  Social Connections: Socially Integrated (03/31/2022)   Social Connection and Isolation Panel    Frequency of Communication with Friends and Family:  Three times a week    Frequency of Social Gatherings with Friends and Family: Once a week    Attends Religious Services: More than 4 times per year    Active Member of [employer] or Organizations: Yes    Attends Banker Meetings: 1 to 4 times per year    Marital Status: Married  Catering manager Violence: Not At Risk (03/31/2022)   Humiliation, Afraid, Rape, and Kick questionnaire    Fear of Current or Ex-Partner: No    Emotionally Abused: No    Physically Abused: No    Sexually Abused: No    FAMILY HISTORY: Family History  Problem Relation Age of Onset   Heart disease Mother    Heart disease Other     ALLERGIES:  has no known allergies.  MEDICATIONS:  Current Outpatient Medications  Medication Sig Dispense Refill  aspirin (ASPIRIN ADULT LOW DOSE) 81 MG EC tablet Take 81 mg by mouth daily. Swallow whole.     atorvastatin  (LIPITOR) 20 MG tablet Take 20 mg by mouth at bedtime.     Cyanocobalamin (B-12 PO) Take 1,000 mcg by mouth daily.     DiphenhydrAMINE  HCl, Sleep, 50 MG CAPS Take 100 mg by mouth at bedtime.     docusate sodium  (COLACE) 100 MG capsule Take 1 capsule by mouth daily as needed for up to 30 doses. 30 capsule 0   hydrochlorothiazide (HYDRODIURIL) 12.5 MG tablet Take 12.5 mg by mouth daily.     ibuprofen (ADVIL) 200 MG tablet Take 400 mg by mouth every 6 (six) hours as needed for moderate pain.     ipratropium (ATROVENT ) 0.03 % nasal spray Place 2 sprays into both nostrils 3 (three) times daily before meals.     Iron -Vitamin C (VITRON-C) 65-125 MG TABS Take by mouth.     metFORMIN (GLUCOPHAGE-XR) 500 MG 24 hr tablet Take 500 mg by mouth 2 (two) times daily.     metoprolol  succinate (TOPROL -XL) 25 MG 24 hr tablet Take 25 mg by mouth in the morning and at bedtime.     Multiple Vitamins-Minerals (PRESERVISION AREDS 2 PO) Take 1 tablet by mouth in the morning and at bedtime.     Omega-3 Fatty Acids (FISH OIL) 1200 MG CAPS Take 2,400 mg by mouth daily.      omeprazole (PRILOSEC) 20 MG capsule Take 20 mg by mouth at bedtime.     silodosin (RAPAFLO) 8 MG CAPS capsule Take 8 mg by mouth at bedtime.     HYDROcodone -acetaminophen  (NORCO) 5-325 MG tablet Take 1-2 tablets by mouth every 4 (four) hours as needed for moderate pain. (Patient not taking: Reported on 10/04/2023) 12 tablet 0   oxyCODONE -acetaminophen  (PERCOCET) 5-325 MG tablet Take 1 tablet by mouth every 4 hours as needed for severe pain. (Patient not taking: Reported on 10/04/2023) 20 tablet 0   No current facility-administered medications for this visit.    Review of Systems  Constitutional:  Positive for fatigue. Negative for appetite change, chills, fever and unexpected weight change.  HENT:   Negative for hearing loss and voice change.   Eyes:  Negative for eye problems and icterus.  Respiratory:  Negative for chest tightness, cough and shortness of breath.   Cardiovascular:  Negative for chest pain and leg swelling.  Gastrointestinal:  Negative for abdominal distention and abdominal pain.  Endocrine: Negative for hot flashes.  Genitourinary:  Negative for difficulty urinating, dysuria and frequency.   Musculoskeletal:  Negative for arthralgias.  Skin:  Negative for itching and rash.  Neurological:  Negative for light-headedness and numbness.  Hematological:  Negative for adenopathy. Does not bruise/bleed easily.  Psychiatric/Behavioral:  Negative for confusion.     PHYSICAL EXAMINATION: ECOG PERFORMANCE STATUS: 0 - Asymptomatic Vitals:   10/04/23 1428  BP: 135/66  Pulse: (!) 56  Resp: 18  Temp: (!) 96.7 F (35.9 C)  SpO2: 96%   Filed Weights   10/04/23 1428  Weight: 226 lb 11.2 oz (102.8 kg)    Physical Exam Constitutional:      General: He is not in acute distress. HENT:     Head: Normocephalic and atraumatic.  Eyes:     General: No scleral icterus. Cardiovascular:     Rate and Rhythm: Normal rate.  Pulmonary:     Effort: Pulmonary effort is normal. No  respiratory distress.  Abdominal:     General: There  is no distension.  Musculoskeletal:     Cervical back: Normal range of motion and neck supple.  Skin:    Findings: No rash.  Neurological:     Mental Status: He is alert and oriented to person, place, and time. Mental status is at baseline.  Psychiatric:        Mood and Affect: Mood normal.      LABORATORY DATA:  I have reviewed the data as listed    Latest Ref Rng & Units 09/10/2023    9:32 AM 09/01/2022   12:55 PM 09/03/2021    5:58 AM  CBC  WBC 4.0 - 10.5 K/uL 8.6  8.0    Hemoglobin 13.0 - 17.0 g/dL 82.9  83.3  88.1   Hematocrit 39.0 - 52.0 % 52.6  52.8  39.5   Platelets 150 - 400 K/uL 419  427        Latest Ref Rng & Units 09/01/2022   12:55 PM 09/03/2021    5:58 AM 12/31/2018    9:31 AM  CMP  Glucose 70 - 99 mg/dL 879  865    BUN 8 - 23 mg/dL 16  17    Creatinine 9.38 - 1.24 mg/dL 8.86  8.66  8.69   Sodium 135 - 145 mmol/L 137  138    Potassium 3.5 - 5.1 mmol/L 4.3  4.7    Chloride 98 - 111 mmol/L 105  105    CO2 22 - 32 mmol/L 24  27    Calcium  8.9 - 10.3 mg/dL 9.1  8.6    Total Protein 6.5 - 8.1 g/dL 6.7     Total Bilirubin 0.3 - 1.2 mg/dL 0.6     Alkaline Phos 38 - 126 U/L 65     AST 15 - 41 U/L 28     ALT 0 - 44 U/L 22         Component Value Date/Time   IRON  102 09/10/2023 0932   TIBC 343 09/10/2023 0932   FERRITIN 39 09/10/2023 0932   IRONPCTSAT 30 09/10/2023 0932     RADIOGRAPHIC STUDIES: I have personally reviewed the radiological images as listed and agreed with the findings in the report. No results found.

## 2023-10-04 NOTE — Telephone Encounter (Signed)
 Bone marrow biopsy  MD 2 weeks after bx.

## 2023-10-04 NOTE — Assessment & Plan Note (Signed)
 Labs are reviewed and discussed with patient. Lab Results  Component Value Date   HGB 17.0 09/10/2023   TIBC 343 09/10/2023   IRONPCTSAT 30 09/10/2023   FERRITIN 39 09/10/2023     No need for IV Venofer   Stop oral iron  supplementation

## 2023-10-04 NOTE — Assessment & Plan Note (Addendum)
 Persistent thrombocytosis despite resolutiton of IDA.  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

## 2023-10-05 ENCOUNTER — Telehealth: Payer: Self-pay | Admitting: *Deleted

## 2023-10-05 NOTE — Telephone Encounter (Signed)
 Patient called to see if he got the information for getting the biopsy put in.  I called the patient and he did not answer and I let him know that Almarie the nurse for Dr. Babara will be working on it and probably give you a call on Monday.

## 2023-10-08 ENCOUNTER — Encounter: Payer: Self-pay | Admitting: Oncology

## 2023-10-12 ENCOUNTER — Other Ambulatory Visit: Payer: Self-pay | Admitting: Interventional Radiology

## 2023-10-12 DIAGNOSIS — Z01818 Encounter for other preprocedural examination: Secondary | ICD-10-CM

## 2023-10-12 NOTE — Progress Notes (Signed)
 Patient for IR Bone Marrow Biopsy on Mon 10/15/23, I called and LVM for the patient on the phone and gave pre-procedure instructions. VM made pt aware to be here at 7:30a, NPO after MN prior to procedure as well as driver post procedure/recovery/discharge. Called 10/12/23

## 2023-10-12 NOTE — H&P (Signed)
 Chief Complaint: Patient was seen in consultation today for iron  deficiency anemia and thrombocytosis, with consideration for bone marrow biopsy and aspiration.  Referring Provider(s): Dr. Zelphia Cap, MD   Supervising Physician: Karalee Beat  Patient Status: [facility] - Out-pt  Patient is Full Code  History of Present Illness: James Yang. is a 83 y.o. male  with PMHx notable for IDA, thrombocytopenia, HTN, HLD, DMT2, CKD,GERD, diverticulosis, and arthritis.  Per Dr. Layvonne progress note on 8/7: Thrombocytosis Persistent thrombocytosis despite resolutiton of IDA.  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 ET versus PV I recommend bone marrow biopsy for further evaluation.  Interventional Radiology was requested for bone marrow biopsy and aspiration. Patient is scheduled for same in IR today.   Patient is alert and laying in bed, calm. Wife is at bedside. Patient is currently without any significant complaints.  Patient denies any fevers, headache, chest pain, SOB, cough, abdominal pain, nausea, vomiting or bleeding.     Past Medical History:  Diagnosis Date   Arthritis    lower back and both ankles   Chronic kidney disease    Kidney stones   Diabetes mellitus without complication (HCC)    Diverticulosis    GERD (gastroesophageal reflux disease)    Hyperlipidemia    Hypertension     Past Surgical History:  Procedure Laterality Date   COLONOSCOPY WITH PROPOFOL  N/A 05/30/2022   Procedure: COLONOSCOPY WITH PROPOFOL ;  Surgeon: Maryruth Ole DASEN, MD;  Location: [facility] ENDOSCOPY;  Service: Endoscopy;  Laterality: N/A;   ESOPHAGEAL MANOMETRY N/A 11/06/2018   Procedure: ESOPHAGEAL MANOMETRY (EM);  Surgeon: Shila Gustav GAILS, MD;  Location: [facility] ENDOSCOPY;  Service: Endoscopy;  Laterality: N/A;   ESOPHAGOGASTRODUODENOSCOPY N/A 05/30/2022   Procedure: ESOPHAGOGASTRODUODENOSCOPY (EGD);  Surgeon: Maryruth Ole DASEN, MD;  Location: [facility] ENDOSCOPY;  Service: Endoscopy;  Laterality: N/A;   KIDNEY STONE SURGERY Right 1983   Dr. Patrina   ROBOTIC ASSITED PARTIAL NEPHRECTOMY Left 09/02/2021   Procedure: XI ROBOTIC ASSITED PARTIAL NEPHRECTOM, EXCISION OF PERINEPHRIC FAT;  Surgeon: Selma Donnice SAUNDERS, MD;  Location: [facility] ORS;  Service: Urology;  Laterality: Left;   TONSILLECTOMY     UMBILICAL HERNIA REPAIR N/A 03/11/2015   Procedure: HERNIA REPAIR UMBILICAL ADULT;  Surgeon: Larinda Unknown Sharps, MD;  Location: [facility] ORS;  Service: General;  Laterality: N/A;    Allergies: Patient has no known allergies.  Medications: Prior to Admission medications   Medication Sig Start Date End Date Taking? Authorizing Provider  aspirin (ASPIRIN ADULT LOW DOSE) 81 MG EC tablet Take 81 mg by mouth daily. Swallow whole.    [provider]  atorvastatin  (LIPITOR) 20 MG tablet Take 20 mg by mouth at bedtime.    [provider]  Cyanocobalamin (B-12 PO) Take 1,000 mcg by mouth daily.    [provider]  DiphenhydrAMINE  HCl, Sleep, 50 MG CAPS Take 100 mg by mouth at bedtime.    [provider]  docusate sodium  (COLACE) 100 MG capsule Take 1 capsule by mouth daily as needed for up to 30 doses. 09/02/21   Selma Donnice SAUNDERS, MD  hydrochlorothiazide (HYDRODIURIL) 12.5 MG tablet Take 12.5 mg by mouth daily. 06/17/21   [provider]  HYDROcodone -acetaminophen  (NORCO) 5-325 MG tablet Take 1-2 tablets by mouth every 4 (four) hours as needed for moderate pain. Patient not taking: Reported on 10/04/2023 03/11/15   Smith, Jarvis Wilton, MD  ibuprofen (ADVIL) 200 MG tablet Take 400  mg by mouth every 6 (six) hours as needed for moderate pain.    [provider]  ipratropium (ATROVENT ) 0.03 % nasal spray Place 2 sprays into both nostrils 3 (three) times daily before meals. 07/31/21   [provider]  Iron -Vitamin C (VITRON-C) 65-125 MG TABS Take by mouth.    [provider]  metFORMIN  (GLUCOPHAGE-XR) 500 MG 24 hr tablet Take 500 mg by mouth 2 (two) times daily. 07/16/21   [provider]  metoprolol  succinate (TOPROL -XL) 25 MG 24 hr tablet Take 25 mg by mouth in the morning and at bedtime. 07/28/21   [provider]  Multiple Vitamins-Minerals (PRESERVISION AREDS 2 PO) Take 1 tablet by mouth in the morning and at bedtime.    [provider]  Omega-3 Fatty Acids (FISH OIL) 1200 MG CAPS Take 2,400 mg by mouth daily.    [provider]  omeprazole (PRILOSEC) 20 MG capsule Take 20 mg by mouth at bedtime.    [provider]  oxyCODONE -acetaminophen  (PERCOCET) 5-325 MG tablet Take 1 tablet by mouth every 4 hours as needed for severe pain. Patient not taking: Reported on 10/04/2023 09/02/21   Selma Donnice SAUNDERS, MD  silodosin (RAPAFLO) 8 MG CAPS capsule Take 8 mg by mouth at bedtime. 07/10/21   [provider]     Family History  Problem Relation Age of Onset   Heart disease Mother    Heart disease Other     Social History   Socioeconomic History   Marital status: Married    Spouse name: Not on file   Number of children: Not on file   Years of education: Not on file   Highest education level: Not on file  Occupational History   Not on file  Tobacco Use   Smoking status: Former    Current packs/day: 0.00    Average packs/day: 1 pack/day for 30.0 years (30.0 ttl pk-yrs)    Types: Cigarettes    Start date: 05/28/1972    Quit date: 05/29/2002    Years since quitting: 21.3   Smokeless tobacco: Never  Vaping Use   Vaping status: Never Used  Substance and Sexual Activity   Alcohol use: Yes    Comment: occasional   Drug use: No   Sexual activity: Not on file  Other Topics Concern   Not on file  Social History Narrative   Not on file   Social Drivers of Health   Financial Resource Strain: Low Risk  (10/23/2022)   Received [facility]   Overall Financial Resource Strain (CARDIA)    Difficulty of Paying  Living Expenses: Not hard at all  Food Insecurity: No Food Insecurity (10/23/2022)   Received from [facility]   Hunger Vital Sign    Within the past 12 months, you worried that your food would run out before you got the money to buy more.: Never true    Within the past 12 months, the food you bought just didn't last and you didn't have money to get more.: Never true  Transportation Needs: No Transportation Needs (10/23/2022)   Received from [facility]    In the past 12 months, has lack of transportation kept you from medical appointments or from getting medications?: No    Lack of Transportation (Non-Medical): No  Physical Activity: Insufficiently Active (03/31/2022)   Exercise Vital Sign    Days of Exercise per Week: 1 day  Minutes of Exercise per Session: 20 min  Stress: No Stress Concern Present (03/31/2022)   [employer] of Occupational Health - Occupational Stress Questionnaire    Feeling of Stress : Not at all  Social Connections: Socially Integrated (03/31/2022)   Social Connection and Isolation Panel    Frequency of Communication with Friends and Family: Three times a week    Frequency of Social Gatherings with Friends and Family: Once a week    Attends Religious Services: More than 4 times per year    Active Member of [employer] or Organizations: Yes    Attends Banker Meetings: 1 to 4 times per year    Marital Status: Married     Review of Systems: A 12 point ROS discussed and pertinent positives are indicated in the HPI above.  All other systems are negative.  Vital Signs: BP (!) 156/69   Pulse (!) 59   Temp (!) 97.4 F (36.3 C) (Oral)   Resp 17   Ht 6' (1.829 m)   Wt 225 lb (102.1 kg)   SpO2 94%   BMI 30.52 kg/m   Advance Care Plan: The advanced care place/surrogate decision maker was discussed at the time of visit and the patient did not wish to discuss or was not able to name a surrogate  decision maker or provide an advance care plan.  Physical Exam Vitals reviewed.  Constitutional:      General: He is not in acute distress.    Appearance: Normal appearance.  HENT:     Mouth/Throat:     Mouth: Mucous membranes are dry.  Cardiovascular:     Rate and Rhythm: Normal rate and regular rhythm.  Pulmonary:     Effort: Pulmonary effort is normal.  Musculoskeletal:        General: Normal range of motion.     Cervical back: Normal range of motion.  Skin:    General: Skin is warm and dry.  Neurological:     Mental Status: He is alert and oriented to person, place, and time.  Psychiatric:        Mood and Affect: Mood normal.        Behavior: Behavior normal.        Thought Content: Thought content normal.        Judgment: Judgment normal.     Imaging: No results found.  Labs:  CBC: Recent Labs    09/10/23 0932 10/15/23 0818  WBC 8.6 8.1  HGB 17.0 17.3*  HCT 52.6* 53.4*  PLT 419* 394    COAGS: No results for input(s): INR, APTT in the last 8760 hours.  BMP: No results for input(s): NA, K, CL, CO2, GLUCOSE, BUN, CALCIUM , CREATININE, GFRNONAA, GFRAA in the last 8760 hours.  Invalid input(s): CMP  LIVER FUNCTION TESTS: No results for input(s): BILITOT, AST, ALT, ALKPHOS, PROT, ALBUMIN in the last 8760 hours.  TUMOR MARKERS: No results for input(s): AFPTM, CEA, CA199, CHROMGRNA in the last 8760 hours.  Assessment and Plan: Per Dr. Layvonne progress note on 8/7: Thrombocytosis Persistent thrombocytosis despite resolutiton of IDA.  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 ET versus PV I recommend bone marrow biopsy for further evaluation.  Patient presents for scheduled bone marrow biopsy and aspiration in IR today.  Patient has been NPO since midnight.  All labs and medications are within acceptable parameters.  No pertinent  allergies.   Risks and benefits of bone marrow biopsy  and aspiration was discussed with the patient and/or patient's family including, but not limited to bleeding, infection, damage to adjacent structures or low yield requiring additional tests.  All of the questions were answered and there is agreement to proceed.  Consent signed and in chart.    Thank you for allowing our service to participate in James Yang. 's care.  Electronically Signed: Carlin DELENA Griffon, PA-C   10/15/2023, 8:25 AM      I spent a total of 30 Minutes in face to face in clinical consultation, greater than 50% of which was counseling/coordinating care for iron deficiency anemia and thrombocytosis, with consideration for bone marrow biopsy and aspiration.

## 2023-10-15 ENCOUNTER — Ambulatory Visit
Admission: RE | Admit: 2023-10-15 | Discharge: 2023-10-15 | Disposition: A | Discharge status: Home / Self Care | Source: Ambulatory Visit | Attending: Oncology | Admitting: Oncology

## 2023-10-15 DIAGNOSIS — N189 Chronic kidney disease, unspecified: Secondary | ICD-10-CM | POA: Insufficient documentation

## 2023-10-15 DIAGNOSIS — D471 Chronic myeloproliferative disease: Secondary | ICD-10-CM | POA: Diagnosis not present

## 2023-10-15 DIAGNOSIS — K219 Gastro-esophageal reflux disease without esophagitis: Secondary | ICD-10-CM | POA: Diagnosis not present

## 2023-10-15 DIAGNOSIS — D751 Secondary polycythemia: Secondary | ICD-10-CM | POA: Diagnosis not present

## 2023-10-15 DIAGNOSIS — D509 Iron deficiency anemia, unspecified: Secondary | ICD-10-CM | POA: Insufficient documentation

## 2023-10-15 DIAGNOSIS — Z7984 Long term (current) use of oral hypoglycemic drugs: Secondary | ICD-10-CM | POA: Insufficient documentation

## 2023-10-15 DIAGNOSIS — Z87891 Personal history of nicotine dependence: Secondary | ICD-10-CM | POA: Insufficient documentation

## 2023-10-15 DIAGNOSIS — D508 Other iron deficiency anemias: Secondary | ICD-10-CM

## 2023-10-15 DIAGNOSIS — D75839 Thrombocytosis, unspecified: Secondary | ICD-10-CM | POA: Insufficient documentation

## 2023-10-15 DIAGNOSIS — I129 Hypertensive chronic kidney disease with stage 1 through stage 4 chronic kidney disease, or unspecified chronic kidney disease: Secondary | ICD-10-CM | POA: Insufficient documentation

## 2023-10-15 DIAGNOSIS — Z1379 Encounter for other screening for genetic and chromosomal anomalies: Secondary | ICD-10-CM | POA: Diagnosis not present

## 2023-10-15 DIAGNOSIS — E1122 Type 2 diabetes mellitus with diabetic chronic kidney disease: Secondary | ICD-10-CM | POA: Diagnosis not present

## 2023-10-15 DIAGNOSIS — E785 Hyperlipidemia, unspecified: Secondary | ICD-10-CM | POA: Insufficient documentation

## 2023-10-15 DIAGNOSIS — Z01818 Encounter for other preprocedural examination: Secondary | ICD-10-CM

## 2023-10-15 HISTORY — PX: IR BONE MARROW BIOPSY & ASPIRATION: IMG5727

## 2023-10-15 LAB — CBC WITH DIFFERENTIAL/PLATELET
Abs Immature Granulocytes: 0.06 K/uL (ref 0.00–0.07)
Basophils Absolute: 0.1 K/uL (ref 0.0–0.1)
Basophils Relative: 1 %
Eosinophils Absolute: 0.2 K/uL (ref 0.0–0.5)
Eosinophils Relative: 2 %
HCT: 53.4 % — ABNORMAL HIGH (ref 39.0–52.0)
Hemoglobin: 17.3 g/dL — ABNORMAL HIGH (ref 13.0–17.0)
Immature Granulocytes: 1 %
Lymphocytes Relative: 12 %
Lymphs Abs: 1 K/uL (ref 0.7–4.0)
MCH: 28.8 pg (ref 26.0–34.0)
MCHC: 32.4 g/dL (ref 30.0–36.0)
MCV: 89 fL (ref 80.0–100.0)
Monocytes Absolute: 0.5 K/uL (ref 0.1–1.0)
Monocytes Relative: 6 %
Neutro Abs: 6.4 K/uL (ref 1.7–7.7)
Neutrophils Relative %: 78 %
Platelets: 394 K/uL (ref 150–400)
RBC: 6 MIL/uL — ABNORMAL HIGH (ref 4.22–5.81)
RDW: 15.3 % (ref 11.5–15.5)
WBC: 8.1 K/uL (ref 4.0–10.5)
nRBC: 0 % (ref 0.0–0.2)

## 2023-10-15 MED ORDER — FENTANYL CITRATE (PF) 100 MCG/2ML IJ SOLN
INTRAMUSCULAR | Status: AC
Start: 2023-10-15 — End: 2023-10-15
  Filled 2023-10-15: qty 2

## 2023-10-15 MED ORDER — SODIUM CHLORIDE 0.9 % IV SOLN: INTRAVENOUS | Status: DC

## 2023-10-15 MED ORDER — MIDAZOLAM HCL 2 MG/2ML IJ SOLN
INTRAMUSCULAR | Status: AC | PRN
  Administered 2023-10-15: 1 mg via INTRAVENOUS

## 2023-10-15 MED ORDER — HEPARIN SOD (PORK) LOCK FLUSH 100 UNIT/ML IV SOLN
INTRAVENOUS | Status: AC
  Filled 2023-10-15: qty 5

## 2023-10-15 MED ORDER — MIDAZOLAM HCL 2 MG/2ML IJ SOLN
INTRAMUSCULAR | Status: AC
  Filled 2023-10-15: qty 2

## 2023-10-15 MED ORDER — FENTANYL CITRATE (PF) 100 MCG/2ML IJ SOLN
INTRAMUSCULAR | Status: AC | PRN
  Administered 2023-10-15: 50 ug via INTRAVENOUS

## 2023-10-15 MED ORDER — LIDOCAINE 1 % OPTIME INJ - NO CHARGE
10.0000 mL | Freq: Once | INTRAMUSCULAR | Status: AC
  Administered 2023-10-15: 10 mL via INTRADERMAL
  Filled 2023-10-15: qty 10

## 2023-10-15 NOTE — Progress Notes (Signed)
 Patient clinically stable post IR BMB per Dr Karalee, tolerated well. Vitals stable pre and post procedure. Received Versed  1 mg along with Fentanyl  50 mcg IV for procedure. Report given to Jann Parker RN post procedure/specials/24.

## 2023-10-15 NOTE — Procedures (Signed)
 Interventional Radiology Procedure Note  Procedure: CT guided aspirate and core biopsy of left iliac bone Complications: None Recommendations: - Bedrest supine x 1 hrs - Hydrocodone PRN  Pain - Follow biopsy results  Signed,  Sterling Big, MD

## 2023-10-18 LAB — SURGICAL PATHOLOGY

## 2023-10-23 ENCOUNTER — Encounter (HOSPITAL_COMMUNITY): Payer: Self-pay | Admitting: Oncology

## 2023-10-30 ENCOUNTER — Encounter: Payer: Self-pay | Admitting: Oncology

## 2023-10-30 ENCOUNTER — Inpatient Hospital Stay: Attending: Oncology | Admitting: Oncology

## 2023-10-30 VITALS — BP 147/65 | HR 57 | Temp 96.8°F | Resp 18 | Wt 229.7 lb

## 2023-10-30 DIAGNOSIS — R5383 Other fatigue: Secondary | ICD-10-CM | POA: Insufficient documentation

## 2023-10-30 DIAGNOSIS — I129 Hypertensive chronic kidney disease with stage 1 through stage 4 chronic kidney disease, or unspecified chronic kidney disease: Secondary | ICD-10-CM | POA: Diagnosis not present

## 2023-10-30 DIAGNOSIS — D509 Iron deficiency anemia, unspecified: Secondary | ICD-10-CM | POA: Insufficient documentation

## 2023-10-30 DIAGNOSIS — Z7984 Long term (current) use of oral hypoglycemic drugs: Secondary | ICD-10-CM | POA: Insufficient documentation

## 2023-10-30 DIAGNOSIS — D751 Secondary polycythemia: Secondary | ICD-10-CM | POA: Diagnosis not present

## 2023-10-30 DIAGNOSIS — D508 Other iron deficiency anemias: Secondary | ICD-10-CM

## 2023-10-30 DIAGNOSIS — E785 Hyperlipidemia, unspecified: Secondary | ICD-10-CM | POA: Diagnosis not present

## 2023-10-30 DIAGNOSIS — N189 Chronic kidney disease, unspecified: Secondary | ICD-10-CM | POA: Diagnosis not present

## 2023-10-30 DIAGNOSIS — E119 Type 2 diabetes mellitus without complications: Secondary | ICD-10-CM | POA: Diagnosis not present

## 2023-10-30 DIAGNOSIS — Z87891 Personal history of nicotine dependence: Secondary | ICD-10-CM | POA: Diagnosis not present

## 2023-10-30 DIAGNOSIS — D45 Polycythemia vera: Secondary | ICD-10-CM | POA: Insufficient documentation

## 2023-10-30 DIAGNOSIS — K219 Gastro-esophageal reflux disease without esophagitis: Secondary | ICD-10-CM | POA: Diagnosis not present

## 2023-10-30 DIAGNOSIS — D75839 Thrombocytosis, unspecified: Secondary | ICD-10-CM | POA: Insufficient documentation

## 2023-10-30 DIAGNOSIS — Z8719 Personal history of other diseases of the digestive system: Secondary | ICD-10-CM | POA: Insufficient documentation

## 2023-10-30 DIAGNOSIS — Z905 Acquired absence of kidney: Secondary | ICD-10-CM | POA: Insufficient documentation

## 2023-10-30 DIAGNOSIS — Z79899 Other long term (current) drug therapy: Secondary | ICD-10-CM | POA: Insufficient documentation

## 2023-10-30 DIAGNOSIS — I10 Essential (primary) hypertension: Secondary | ICD-10-CM | POA: Insufficient documentation

## 2023-10-30 DIAGNOSIS — Z87442 Personal history of urinary calculi: Secondary | ICD-10-CM | POA: Insufficient documentation

## 2023-10-30 MED ORDER — HYDROXYUREA 500 MG PO CAPS: 500.0000 mg | ORAL_CAPSULE | Freq: Every day | ORAL | 0 refills | Status: DC

## 2023-10-30 NOTE — Assessment & Plan Note (Signed)
 Hold off iron  supplementation in the context of PV.

## 2023-10-30 NOTE — Assessment & Plan Note (Addendum)
 Jak2 mutated myeloproliferative disease, favor polycythemia vera  Bone marrow biopsy results were reviewed with patient.  Recommend patient to start Hydroxyurea  500mg  daily.  Recommend phlebotomy, goal of Hct 45.

## 2023-10-30 NOTE — Progress Notes (Signed)
 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  Recommend patient to start Hydroxyurea  500mg  daily.  Recommend phlebotomy, goal of Hct 45.    IDA (iron  deficiency anemia) Hold off iron  supplementation in the context of PV.  Orders Placed This Encounter  Procedures   CBC with Differential (Cancer Center Only)    Standing Status:   Future    Expected Date:   11/20/2023    Expiration Date:   02/18/2024   CMP (Cancer Center only)    Standing Status:   Future    Expected Date:   11/20/2023    Expiration Date:   02/18/2024   Follow up in 3 week.  All questions were answered. The patient knows to call the clinic with any problems, questions or concerns.  Zelphia Cap, MD, PhD [facility] Hematology Oncology 10/30/2023     HISTORY OF PRESENTING ILLNESS:  James Bogdon. is a  83 y.o.  male with PMH listed below who was referred to me for anemia Reviewed patient's recent labs that was done.  He was found to have abnormal CBC on 03/07/2022 cbc showed hb 13, mcv 74, platelet 557 Iron  saturation 11, ferritin 3 Some fatigue.  He denies recent chest pain on exertion, shortness of breath on minimal exertion, pre-syncopal episodes, or palpitations He had not noticed any recent bleeding such as epistaxis, hematuria or hematochezia.  He denies over the counter NSAID ingestion. Cologuard was 5 years ago  he has established care with [facility]  Last colonoscopy, findings include diverticulosis in sigmoid colon, interval hemorrhoids.  INTERVAL HISTORY James Taras. is a 83 y.o. male who has above history reviewed by me today presents for follow up visit for discussion of lab  results.  10/15/2023 Bone marrow biopsy.  BONE MARROW, ASPIRATE, CLOT, CORE:  - JAK2 positive myeloproliferative neoplasm, morphologically favor  polycythemia, see note   PERIPHERAL BLOOD:  - Erythrocytosis and thrombocytosis   There are limited spicules for evaluation and the biopsy consists of predominantly of periosteum and cortical bone with limited subcortical marrow.  However, the subcortical marrow is focally hypercellular for age with an overall apparent panmyelosis with megakaryocytic hyperplasia which are hyperlobulated and present in clusters.  This megakaryocytic hyperplasia is also present on the limited aspirate smear slides.  While there is limited material for evaluation the overall presence of an apparent panmyelosis and the range of megakaryocytic morphologies in conjunction with the peripheral blood erythrocytosis and borderline thrombocytosis favors a diagnosis of polycythemia vera.     A reticulin stain shows focal mild reticulin fiber deposition, overall MF 0 to MF 1 out of MF 3, on limited material.    MEDICAL HISTORY:  Past Medical History:  Diagnosis Date   Arthritis    lower back and both ankles   Chronic kidney disease    Kidney stones   Diabetes mellitus without complication (HCC)    Diverticulosis    GERD (gastroesophageal reflux disease)    Hyperlipidemia    Hypertension     SURGICAL HISTORY: Past Surgical History:  Procedure Laterality Date   COLONOSCOPY WITH PROPOFOL  N/A 05/30/2022   Procedure: COLONOSCOPY WITH PROPOFOL ;  Surgeon: Maryruth Ole DASEN, MD;  Location: [facility] ENDOSCOPY;  Service: Endoscopy;  Laterality: N/A;   ESOPHAGEAL  MANOMETRY N/A 11/06/2018   Procedure: ESOPHAGEAL MANOMETRY (EM);  Surgeon: Shila Gustav GAILS, MD;  Location: [facility] ENDOSCOPY;  Service: Endoscopy;  Laterality: N/A;   ESOPHAGOGASTRODUODENOSCOPY N/A 05/30/2022   Procedure: ESOPHAGOGASTRODUODENOSCOPY (EGD);  Surgeon: Maryruth Ole DASEN, MD;  Location: [facility] ENDOSCOPY;   Service: Endoscopy;  Laterality: N/A;   IR BONE MARROW BIOPSY & ASPIRATION  10/15/2023   KIDNEY STONE SURGERY Right 1983   Dr. Patrina   ROBOTIC ASSITED PARTIAL NEPHRECTOMY Left 09/02/2021   Procedure: XI ROBOTIC ASSITED PARTIAL NEPHRECTOM, EXCISION OF PERINEPHRIC FAT;  Surgeon: Selma Donnice SAUNDERS, MD;  Location: [facility] ORS;  Service: Urology;  Laterality: Left;   TONSILLECTOMY     UMBILICAL HERNIA REPAIR N/A 03/11/2015   Procedure: HERNIA REPAIR UMBILICAL ADULT;  Surgeon: Larinda Unknown Sharps, MD;  Location: [facility] ORS;  Service: General;  Laterality: N/A;    SOCIAL HISTORY: Social History   Socioeconomic History   Marital status: Married    Spouse name: Not on file   Number of children: Not on file   Years of education: Not on file   Highest education level: Not on file  Occupational History   Not on file  Tobacco Use   Smoking status: Former    Current packs/day: 0.00    Average packs/day: 1 pack/day for 30.0 years (30.0 ttl pk-yrs)    Types: Cigarettes    Start date: 05/28/1972    Quit date: 05/29/2002    Years since quitting: 21.4   Smokeless tobacco: Never  Vaping Use   Vaping status: Never Used  Substance and Sexual Activity   Alcohol use: Yes    Comment: occasional   Drug use: No   Sexual activity: Not on file  Other Topics Concern   Not on file  Social History Narrative   Not on file   Social Drivers of Health   Financial Resource Strain: Low Risk  (10/23/2022)   Received from [facility]   Overall Financial Resource Strain (CARDIA)    Difficulty of Paying Living Expenses: Not hard at all  Food Insecurity: No Food Insecurity (10/23/2022)   Received from [facility]   Hunger Vital Sign    Within the past 12 months, you worried that your food would run out before you got the money to buy more.: Never true    Within the past 12 months, the food you bought just didn't last and you didn't have money to get more.: Never true  Transportation  Needs: No Transportation Needs (10/23/2022)   Received from [facility] - Transportation    In the past 12 months, has lack of transportation kept you from medical appointments or from getting medications?: No    Lack of Transportation (Non-Medical): No  Physical Activity: Insufficiently Active (03/31/2022)   Exercise Vital Sign    Days of Exercise per Week: 1 day    Minutes of Exercise per Session: 20 min  Stress: No Stress Concern Present (03/31/2022)   [employer] of Occupational [employer] - Occupational Stress Questionnaire    Feeling of Stress : Not at all  Social Connections: Socially Integrated (03/31/2022)   Social Connection and Isolation Panel    Frequency of Communication with Friends and Family: Three times a week    Frequency of Social Gatherings with Friends and Family: Once a week    Attends Religious Services: More than 4 times per year    Active Member of Clubs or Organizations: Yes    Attends  Club or Organization Meetings: 1 to 4 times per year    Marital Status: Married  Catering manager Violence: Not At Risk (03/31/2022)   Humiliation, Afraid, Rape, and Kick questionnaire    Fear of Current or Ex-Partner: No    Emotionally Abused: No    Physically Abused: No    Sexually Abused: No    FAMILY HISTORY: Family History  Problem Relation Age of Onset   Heart disease Mother    Heart disease Other     ALLERGIES:  has no known allergies.  MEDICATIONS:  Current Outpatient Medications  Medication Sig Dispense Refill   aspirin (ASPIRIN ADULT LOW DOSE) 81 MG EC tablet Take 81 mg by mouth daily. Swallow whole.     atorvastatin  (LIPITOR) 20 MG tablet Take 20 mg by mouth at bedtime.     Cyanocobalamin (B-12 PO) Take 1,000 mcg by mouth daily.     DiphenhydrAMINE  HCl, Sleep, 50 MG CAPS Take 100 mg by mouth at bedtime.     docusate sodium  (COLACE) 100 MG capsule Take 1 capsule by mouth daily as needed for up to 30 doses. 30 capsule 0    hydrochlorothiazide (HYDRODIURIL) 12.5 MG tablet Take 12.5 mg by mouth daily.     hydroxyurea  (HYDREA ) 500 MG capsule Take 1 capsule (500 mg total) by mouth daily. May take with food to minimize GI side effects. 30 capsule 0   ibuprofen (ADVIL) 200 MG tablet Take 400 mg by mouth every 6 (six) hours as needed for moderate pain.     ipratropium (ATROVENT ) 0.03 % nasal spray Place 2 sprays into both nostrils 3 (three) times daily before meals.     Iron -Vitamin C (VITRON-C) 65-125 MG TABS Take by mouth.     metFORMIN (GLUCOPHAGE-XR) 500 MG 24 hr tablet Take 500 mg by mouth 2 (two) times daily.     metoprolol  succinate (TOPROL -XL) 25 MG 24 hr tablet Take 25 mg by mouth in the morning and at bedtime.     Multiple Vitamins-Minerals (PRESERVISION AREDS 2 PO) Take 1 tablet by mouth in the morning and at bedtime.     Omega-3 Fatty Acids (FISH OIL) 1200 MG CAPS Take 2,400 mg by mouth daily.     omeprazole (PRILOSEC) 20 MG capsule Take 20 mg by mouth at bedtime.     oxyCODONE -acetaminophen  (PERCOCET) 5-325 MG tablet Take 1 tablet by mouth every 4 hours as needed for severe pain. 20 tablet 0   silodosin (RAPAFLO) 8 MG CAPS capsule Take 8 mg by mouth at bedtime.     HYDROcodone -acetaminophen  (NORCO) 5-325 MG tablet Take 1-2 tablets by mouth every 4 (four) hours as needed for moderate pain. (Patient not taking: Reported on 10/30/2023) 12 tablet 0   No current facility-administered medications for this visit.    Review of Systems  Constitutional:  Positive for fatigue. Negative for appetite change, chills, fever and unexpected weight change.  HENT:   Negative for hearing loss and voice change.   Eyes:  Negative for eye problems and icterus.  Respiratory:  Negative for chest tightness, cough and shortness of breath.   Cardiovascular:  Negative for chest pain and leg swelling.  Gastrointestinal:  Negative for abdominal distention and abdominal pain.  Endocrine: Negative for hot flashes.  Genitourinary:   Negative for difficulty urinating, dysuria and frequency.   Musculoskeletal:  Negative for arthralgias.  Skin:  Negative for itching and rash.  Neurological:  Negative for light-headedness and numbness.  Hematological:  Negative for adenopathy. Does not bruise/bleed easily.  Psychiatric/Behavioral:  Negative for confusion.     PHYSICAL EXAMINATION: ECOG PERFORMANCE STATUS: 0 - Asymptomatic Vitals:   10/30/23 1347  BP: (!) 147/65  Pulse: (!) 57  Resp: 18  Temp: (!) 96.8 F (36 C)  SpO2: 97%   Filed Weights   10/30/23 1347  Weight: 229 lb 11.2 oz (104.2 kg)    Physical Exam Constitutional:      General: He is not in acute distress. HENT:     Head: Normocephalic and atraumatic.  Eyes:     General: No scleral icterus. Cardiovascular:     Rate and Rhythm: Normal rate.  Pulmonary:     Effort: Pulmonary effort is normal. No respiratory distress.  Abdominal:     General: There is no distension.  Musculoskeletal:     Cervical back: Normal range of motion and neck supple.  Skin:    Findings: No rash.  Neurological:     Mental Status: He is alert and oriented to person, place, and time. Mental status is at baseline.  Psychiatric:        Mood and Affect: Mood normal.      LABORATORY DATA:  I have reviewed the data as listed    Latest Ref Rng & Units 10/15/2023    8:18 AM 09/10/2023    9:32 AM 09/01/2022   12:55 PM  CBC  WBC 4.0 - 10.5 K/uL 8.1  8.6  8.0   Hemoglobin 13.0 - 17.0 g/dL 82.6  82.9  83.3   Hematocrit 39.0 - 52.0 % 53.4  52.6  52.8   Platelets 150 - 400 K/uL 394  419  427       Latest Ref Rng & Units 09/01/2022   12:55 PM 09/03/2021    5:58 AM 12/31/2018    9:31 AM  CMP  Glucose 70 - 99 mg/dL 879  865    BUN 8 - 23 mg/dL 16  17    Creatinine 9.38 - 1.24 mg/dL 8.86  8.66  8.69   Sodium 135 - 145 mmol/L 137  138    Potassium 3.5 - 5.1 mmol/L 4.3  4.7    Chloride 98 - 111 mmol/L 105  105    CO2 22 - 32 mmol/L 24  27    Calcium  8.9 - 10.3 mg/dL 9.1  8.6     Total Protein 6.5 - 8.1 g/dL 6.7     Total Bilirubin 0.3 - 1.2 mg/dL 0.6     Alkaline Phos 38 - 126 U/L 65     AST 15 - 41 U/L 28     ALT 0 - 44 U/L 22         Component Value Date/Time   IRON  102 09/10/2023 0932   TIBC 343 09/10/2023 0932   FERRITIN 39 09/10/2023 0932   IRONPCTSAT 30 09/10/2023 0932     RADIOGRAPHIC STUDIES: I have personally reviewed the radiological images as listed and agreed with the findings in the report. IR BONE MARROW BIOPSY & ASPIRATION Result Date: 10/15/2023 INDICATION: Chronic iron  deficiency anemia and thrombocytopenia EXAM: FLUOROSCOPIC GUIDED BONE MARROW ASPIRATION AND CORE BIOPSY Interventional Radiologist:  Wilkie LOIS Lent, MD MEDICATIONS: None. ANESTHESIA/SEDATION: Moderate (conscious) sedation was employed during this procedure. A total of 1 milligrams versed  and 50 micrograms fentanyl  were administered intravenously by the Radiology nurse. The patient's level of consciousness and vital signs were monitored continuously by radiology nursing throughout the procedure under my direct supervision. Total monitored sedation time: 10 minutes FLUOROSCOPY: Radiation exposure index: 20  mGy, reference air kerma COMPLICATIONS: None immediate. Estimated blood loss: <25 mL PROCEDURE: Informed written consent was obtained from the patient after a thorough discussion of the procedural risks, benefits and alternatives. All questions were addressed. Maximal Sterile Barrier Technique was utilized including caps, mask, sterile gowns, sterile gloves, sterile drape, hand hygiene and skin antiseptic. A timeout was performed prior to the initiation of the procedure. The patient was positioned prone and localization fluoroscopy was performed of the pelvis to demonstrate the iliac marrow spaces. Maximal barrier sterile technique utilized including caps, mask, sterile gowns, sterile gloves, large sterile drape, hand hygiene, and betadine prep. Under sterile conditions and local  anesthesia, an 11 gauge coaxial bone biopsy needle was advanced into the left iliac marrow space. Needle position was confirmed with imaging. Initially, bone marrow aspiration was performed. Next, the 11 gauge outer cannula was utilized to obtain a left iliac bone marrow core biopsy. Needle was removed. Hemostasis was obtained with compression. The patient tolerated the procedure well. Samples were prepared with the cytotechnologist. IMPRESSION: Successful left iliac bone marrow aspiration and core biopsy. Electronically Signed   By: Wilkie Lent M.D.   On: 10/15/2023 10:56

## 2023-11-02 ENCOUNTER — Inpatient Hospital Stay

## 2023-11-02 VITALS — BP 139/65 | HR 57 | Temp 97.8°F | Resp 18

## 2023-11-02 DIAGNOSIS — D45 Polycythemia vera: Secondary | ICD-10-CM

## 2023-11-02 DIAGNOSIS — D508 Other iron deficiency anemias: Secondary | ICD-10-CM

## 2023-11-20 ENCOUNTER — Inpatient Hospital Stay

## 2023-11-20 ENCOUNTER — Inpatient Hospital Stay (HOSPITAL_BASED_OUTPATIENT_CLINIC_OR_DEPARTMENT_OTHER): Admitting: Oncology

## 2023-11-20 ENCOUNTER — Encounter: Payer: Self-pay | Admitting: Oncology

## 2023-11-20 VITALS — BP 131/68 | HR 58

## 2023-11-20 VITALS — BP 138/64 | HR 61 | Temp 97.6°F | Resp 18 | Wt 227.7 lb

## 2023-11-20 DIAGNOSIS — D45 Polycythemia vera: Secondary | ICD-10-CM

## 2023-11-20 DIAGNOSIS — D75839 Thrombocytosis, unspecified: Secondary | ICD-10-CM

## 2023-11-20 DIAGNOSIS — D508 Other iron deficiency anemias: Secondary | ICD-10-CM | POA: Diagnosis not present

## 2023-11-20 LAB — CBC WITH DIFFERENTIAL (CANCER CENTER ONLY)
Abs Immature Granulocytes: 0.06 K/uL (ref 0.00–0.07)
Basophils Absolute: 0.1 K/uL (ref 0.0–0.1)
Basophils Relative: 1 %
Eosinophils Absolute: 0.1 K/uL (ref 0.0–0.5)
Eosinophils Relative: 2 %
HCT: 53.4 % — ABNORMAL HIGH (ref 39.0–52.0)
Hemoglobin: 17.2 g/dL — ABNORMAL HIGH (ref 13.0–17.0)
Immature Granulocytes: 1 %
Lymphocytes Relative: 11 %
Lymphs Abs: 0.9 K/uL (ref 0.7–4.0)
MCH: 29.4 pg (ref 26.0–34.0)
MCHC: 32.2 g/dL (ref 30.0–36.0)
MCV: 91.1 fL (ref 80.0–100.0)
Monocytes Absolute: 0.4 K/uL (ref 0.1–1.0)
Monocytes Relative: 5 %
Neutro Abs: 6.5 K/uL (ref 1.7–7.7)
Neutrophils Relative %: 80 %
Platelet Count: 409 K/uL — ABNORMAL HIGH (ref 150–400)
RBC: 5.86 MIL/uL — ABNORMAL HIGH (ref 4.22–5.81)
RDW: 17.7 % — ABNORMAL HIGH (ref 11.5–15.5)
WBC Count: 8.1 K/uL (ref 4.0–10.5)
nRBC: 0 % (ref 0.0–0.2)

## 2023-11-20 LAB — CMP (CANCER CENTER ONLY)
ALT: 26 U/L (ref 0–44)
AST: 28 U/L (ref 15–41)
Albumin: 4 g/dL (ref 3.5–5.0)
Alkaline Phosphatase: 59 U/L (ref 38–126)
Anion gap: 8 (ref 5–15)
BUN: 18 mg/dL (ref 8–23)
CO2: 26 mmol/L (ref 22–32)
Calcium: 9 mg/dL (ref 8.9–10.3)
Chloride: 102 mmol/L (ref 98–111)
Creatinine: 1.23 mg/dL (ref 0.61–1.24)
GFR, Estimated: 59 mL/min — ABNORMAL LOW (ref >60)
Glucose, Bld: 106 mg/dL — ABNORMAL HIGH (ref 70–99)
Potassium: 4.4 mmol/L (ref 3.5–5.1)
Sodium: 136 mmol/L (ref 135–145)
Total Bilirubin: 1.1 mg/dL (ref 0.0–1.2)
Total Protein: 6.8 g/dL (ref 6.5–8.1)

## 2023-11-20 MED ORDER — HYDROXYUREA 500 MG PO CAPS: 1000.0000 mg | ORAL_CAPSULE | Freq: Every day | ORAL | 0 refills | Status: DC

## 2023-11-20 NOTE — Assessment & Plan Note (Signed)
 Hold off iron  supplementation in the context of PV.

## 2023-11-20 NOTE — Progress Notes (Signed)
 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  Hematocrit is not at goal yet. Increase Hydroxyurea  1000 daily.  Recommend phlebotomy, goal of Hct 45.  Continue aspirin 81 mg daily.  IDA (iron  deficiency anemia) Hold off iron  supplementation in the context of PV.  Orders Placed This Encounter  Procedures   CBC with Differential (Cancer Center Only)    Standing Status:   Future    Expected Date:   12/11/2023    Expiration Date:   03/10/2024   CMP (Cancer Center only)    Standing Status:   Future    Expected Date:   12/11/2023    Expiration Date:   03/10/2024   Hemoglobin and Hematocrit (Cancer Center Only)    Standing Status:   Future    Expected Date:   11/27/2023    Expiration Date:   02/25/2024   Follow up in 3 week.  All questions were answered. The patient knows to call the clinic with any problems, questions or concerns.  Zelphia Cap, MD, PhD [facility] Hematology Oncology 11/20/2023     HISTORY OF PRESENTING ILLNESS:  James Yang. is a  83 y.o.  male with PMH listed below who was referred to me for anemia Reviewed patient's recent labs that was done.  He was found to have abnormal CBC on 03/07/2022 cbc showed hb 13, mcv 74, platelet 557 Iron  saturation 11, ferritin 3 Some fatigue.  He denies recent chest pain on exertion, shortness of breath on minimal exertion, pre-syncopal episodes, or palpitations He had not noticed any recent bleeding such as epistaxis, hematuria or hematochezia.  He denies over the counter NSAID ingestion. Cologuard was 5 years ago  he has established care with [facility]  Last colonoscopy, findings include diverticulosis in sigmoid colon,  interval hemorrhoids.   10/15/2023 Bone marrow biopsy.  BONE MARROW, ASPIRATE, CLOT, CORE:  - JAK2 positive myeloproliferative neoplasm, morphologically favor  polycythemia, see note   PERIPHERAL BLOOD:  - Erythrocytosis and thrombocytosis   There are limited spicules for evaluation and the biopsy consists of predominantly of periosteum and cortical bone with limited subcortical marrow.  However, the subcortical marrow is focally hypercellular for age with an overall apparent panmyelosis with megakaryocytic hyperplasia which are hyperlobulated and present in clusters.  This megakaryocytic hyperplasia is also present on the limited aspirate smear slides.  While there is limited material for evaluation the overall presence of an apparent panmyelosis and the range of megakaryocytic morphologies in conjunction with the peripheral blood erythrocytosis and borderline thrombocytosis favors a diagnosis of polycythemia vera.     A reticulin stain shows focal mild reticulin fiber deposition, overall MF 0 to MF 1 out of MF 3, on limited material.     INTERVAL HISTORY James Yang. is a 83 y.o. male who has above history reviewed by me today presents for follow up visit for JAK2 positive polycythemia vera. Patient tolerates hydroxyurea  500 mg daily.  No new complaints.   MEDICAL HISTORY:  Past Medical History:  Diagnosis Date   Arthritis    lower back and both ankles   Chronic kidney disease    Kidney stones   Diabetes mellitus without complication (HCC)    Diverticulosis    GERD (gastroesophageal reflux disease)  Hyperlipidemia    Hypertension     SURGICAL HISTORY: Past Surgical History:  Procedure Laterality Date   COLONOSCOPY WITH PROPOFOL  N/A 05/30/2022   Procedure: COLONOSCOPY WITH PROPOFOL ;  Surgeon: Maryruth Ole DASEN, MD;  Location: [facility] ENDOSCOPY;  Service: Endoscopy;  Laterality: N/A;   ESOPHAGEAL MANOMETRY N/A 11/06/2018   Procedure: ESOPHAGEAL MANOMETRY (EM);   Surgeon: Shila Gustav GAILS, MD;  Location: [facility] ENDOSCOPY;  Service: Endoscopy;  Laterality: N/A;   ESOPHAGOGASTRODUODENOSCOPY N/A 05/30/2022   Procedure: ESOPHAGOGASTRODUODENOSCOPY (EGD);  Surgeon: Maryruth Ole DASEN, MD;  Location: [facility] ENDOSCOPY;  Service: Endoscopy;  Laterality: N/A;   IR BONE MARROW BIOPSY & ASPIRATION  10/15/2023   KIDNEY STONE SURGERY Right 1983   Dr. Patrina   ROBOTIC ASSITED PARTIAL NEPHRECTOMY Left 09/02/2021   Procedure: XI ROBOTIC ASSITED PARTIAL NEPHRECTOM, EXCISION OF PERINEPHRIC FAT;  Surgeon: Selma Donnice SAUNDERS, MD;  Location: [facility] ORS;  Service: Urology;  Laterality: Left;   TONSILLECTOMY     UMBILICAL HERNIA REPAIR N/A 03/11/2015   Procedure: HERNIA REPAIR UMBILICAL ADULT;  Surgeon: Larinda Unknown Sharps, MD;  Location: [facility] ORS;  Service: General;  Laterality: N/A;    SOCIAL HISTORY: Social History   Socioeconomic History   Marital status: Married    Spouse name: Not on file   Number of children: Not on file   Years of education: Not on file   Highest education level: Not on file  Occupational History   Not on file  Tobacco Use   Smoking status: Former    Current packs/day: 0.00    Average packs/day: 1 pack/day for 30.0 years (30.0 ttl pk-yrs)    Types: Cigarettes    Start date: 05/28/1972    Quit date: 05/29/2002    Years since quitting: 21.4   Smokeless tobacco: Never  Vaping Use   Vaping status: Never Used  Substance and Sexual Activity   Alcohol use: Yes    Comment: occasional   Drug use: No   Sexual activity: Not on file  Other Topics Concern   Not on file  Social History Narrative   Not on file   Social Drivers of Health   Financial Resource Strain: Low Risk  (11/08/2023)   Received from [facility]   Overall Financial Resource Strain (CARDIA)    Difficulty of Paying Living Expenses: Not hard at all  Food Insecurity: No Food Insecurity (11/08/2023)   Received from [facility]   Hunger Vital Sign    Within  the past 12 months, you worried that your food would run out before you got the money to buy more.: Never true    Within the past 12 months, the food you bought just didn't last and you didn't have money to get more.: Never true  Transportation Needs: No Transportation Needs (11/08/2023)   Received from [facility] - Transportation    In the past 12 months, has lack of transportation kept you from medical appointments or from getting medications?: No    Lack of Transportation (Non-Medical): No  Physical Activity: Insufficiently Active (03/31/2022)   Exercise Vital Sign    Days of Exercise per Week: 1 day    Minutes of Exercise per Session: 20 min  Stress: No Stress Concern Present (03/31/2022)   [employer] of Occupational Health - Occupational Stress Questionnaire    Feeling of Stress : Not at all  Social Connections: Socially Integrated (03/31/2022)   Social Connection and Isolation Panel    Frequency  of Communication with Friends and Family: Three times a week    Frequency of Social Gatherings with Friends and Family: Once a week    Attends Religious Services: More than 4 times per year    Active Member of [employer] or Organizations: Yes    Attends Banker Meetings: 1 to 4 times per year    Marital Status: Married  Catering manager Violence: Not At Risk (03/31/2022)   Humiliation, Afraid, Rape, and Kick questionnaire    Fear of Current or Ex-Partner: No    Emotionally Abused: No    Physically Abused: No    Sexually Abused: No    FAMILY HISTORY: Family History  Problem Relation Age of Onset   Heart disease Mother    Heart disease Other     ALLERGIES:  has no known allergies.  MEDICATIONS:  Current Outpatient Medications  Medication Sig Dispense Refill   aspirin (ASPIRIN ADULT LOW DOSE) 81 MG EC tablet Take 81 mg by mouth daily. Swallow whole.     atorvastatin  (LIPITOR) 20 MG tablet Take 20 mg by mouth at bedtime.     Cyanocobalamin (B-12  PO) Take 1,000 mcg by mouth daily.     DiphenhydrAMINE  HCl, Sleep, 50 MG CAPS Take 100 mg by mouth at bedtime.     docusate sodium  (COLACE) 100 MG capsule Take 1 capsule by mouth daily as needed for up to 30 doses. 30 capsule 0   hydrochlorothiazide (HYDRODIURIL) 12.5 MG tablet Take 12.5 mg by mouth daily.     ibuprofen (ADVIL) 200 MG tablet Take 400 mg by mouth every 6 (six) hours as needed for moderate pain.     ipratropium (ATROVENT ) 0.03 % nasal spray Place 2 sprays into both nostrils 3 (three) times daily before meals.     Iron -Vitamin C (VITRON-C) 65-125 MG TABS Take by mouth.     metFORMIN (GLUCOPHAGE-XR) 500 MG 24 hr tablet Take 500 mg by mouth 2 (two) times daily.     metoprolol  succinate (TOPROL -XL) 25 MG 24 hr tablet Take 25 mg by mouth in the morning and at bedtime.     Multiple Vitamins-Minerals (PRESERVISION AREDS 2 PO) Take 1 tablet by mouth in the morning and at bedtime.     Omega-3 Fatty Acids (FISH OIL) 1200 MG CAPS Take 2,400 mg by mouth daily.     omeprazole (PRILOSEC) 20 MG capsule Take 20 mg by mouth at bedtime.     oxyCODONE -acetaminophen  (PERCOCET) 5-325 MG tablet Take 1 tablet by mouth every 4 hours as needed for severe pain. 20 tablet 0   silodosin (RAPAFLO) 8 MG CAPS capsule Take 8 mg by mouth at bedtime.     HYDROcodone -acetaminophen  (NORCO) 5-325 MG tablet Take 1-2 tablets by mouth every 4 (four) hours as needed for moderate pain. (Patient not taking: Reported on 11/20/2023) 12 tablet 0   hydroxyurea  (HYDREA ) 500 MG capsule Take 2 capsules (1,000 mg total) by mouth daily. May take with food to minimize GI side effects. 60 capsule 0   No current facility-administered medications for this visit.    Review of Systems  Constitutional:  Positive for fatigue. Negative for appetite change, chills, fever and unexpected weight change.  HENT:   Negative for hearing loss and voice change.   Eyes:  Negative for eye problems and icterus.  Respiratory:  Negative for chest  tightness, cough and shortness of breath.   Cardiovascular:  Negative for chest pain and leg swelling.  Gastrointestinal:  Negative for abdominal distention and abdominal  pain.  Endocrine: Negative for hot flashes.  Genitourinary:  Negative for difficulty urinating, dysuria and frequency.   Musculoskeletal:  Negative for arthralgias.  Skin:  Negative for itching and rash.  Neurological:  Negative for light-headedness and numbness.  Hematological:  Negative for adenopathy. Does not bruise/bleed easily.  Psychiatric/Behavioral:  Negative for confusion.     PHYSICAL EXAMINATION: ECOG PERFORMANCE STATUS: 0 - Asymptomatic Vitals:   11/20/23 1406 11/20/23 1414  BP: (!) 147/58 138/64  Pulse: 61   Resp: 18   Temp: 97.6 F (36.4 C)   SpO2: 95%    Filed Weights   11/20/23 1406  Weight: 227 lb 11.2 oz (103.3 kg)    Physical Exam Constitutional:      General: He is not in acute distress. HENT:     Head: Normocephalic and atraumatic.  Eyes:     General: No scleral icterus. Cardiovascular:     Rate and Rhythm: Normal rate.  Pulmonary:     Effort: Pulmonary effort is normal. No respiratory distress.  Abdominal:     General: There is no distension.  Musculoskeletal:     Cervical back: Normal range of motion and neck supple.  Skin:    Findings: No rash.  Neurological:     Mental Status: He is alert and oriented to person, place, and time. Mental status is at baseline.  Psychiatric:        Mood and Affect: Mood normal.      LABORATORY DATA:  I have reviewed the data as listed    Latest Ref Rng & Units 11/20/2023    1:53 PM 10/15/2023    8:18 AM 09/10/2023    9:32 AM  CBC  WBC 4.0 - 10.5 K/uL 8.1  8.1  8.6   Hemoglobin 13.0 - 17.0 g/dL 82.7  82.6  82.9   Hematocrit 39.0 - 52.0 % 53.4  53.4  52.6   Platelets 150 - 400 K/uL 409  394  419       Latest Ref Rng & Units 11/20/2023    1:53 PM 09/01/2022   12:55 PM 09/03/2021    5:58 AM  CMP  Glucose 70 - 99 mg/dL 893  879  865    BUN 8 - 23 mg/dL 18  16  17    Creatinine 0.61 - 1.24 mg/dL 8.76  8.86  8.66   Sodium 135 - 145 mmol/L 136  137  138   Potassium 3.5 - 5.1 mmol/L 4.4  4.3  4.7   Chloride 98 - 111 mmol/L 102  105  105   CO2 22 - 32 mmol/L 26  24  27    Calcium  8.9 - 10.3 mg/dL 9.0  9.1  8.6   Total Protein 6.5 - 8.1 g/dL 6.8  6.7    Total Bilirubin 0.0 - 1.2 mg/dL 1.1  0.6    Alkaline Phos 38 - 126 U/L 59  65    AST 15 - 41 U/L 28  28    ALT 0 - 44 U/L 26  22        Component Value Date/Time   IRON  102 09/10/2023 0932   TIBC 343 09/10/2023 0932   FERRITIN 39 09/10/2023 0932   IRONPCTSAT 30 09/10/2023 0932     RADIOGRAPHIC STUDIES: I have personally reviewed the radiological images as listed and agreed with the findings in the report. No results found.

## 2023-11-20 NOTE — Assessment & Plan Note (Addendum)
 Jak2 mutated myeloproliferative disease, favor polycythemia vera  Bone marrow biopsy results were reviewed with patient.  Hematocrit is not at goal yet. Increase Hydroxyurea  1000 daily.  Recommend phlebotomy, goal of Hct 45.  Continue aspirin 81 mg daily.

## 2023-11-27 ENCOUNTER — Inpatient Hospital Stay

## 2023-11-27 VITALS — BP 134/69 | HR 62 | Temp 98.2°F | Resp 18

## 2023-11-27 DIAGNOSIS — D45 Polycythemia vera: Secondary | ICD-10-CM

## 2023-11-27 DIAGNOSIS — D508 Other iron deficiency anemias: Secondary | ICD-10-CM

## 2023-11-27 LAB — HEMOGLOBIN AND HEMATOCRIT (CANCER CENTER ONLY)
HCT: 46.3 % (ref 39.0–52.0)
Hemoglobin: 15 g/dL (ref 13.0–17.0)

## 2023-11-27 MED ORDER — SODIUM CHLORIDE 0.9 % IV SOLN
Freq: Once | INTRAVENOUS | Status: DC
  Filled 2023-11-27: qty 250

## 2023-11-27 NOTE — Progress Notes (Signed)
 James Yang. presents today for phlebotomy per MD orders. Phlebotomy procedure started at 1415 and ended at 1426. 500 mls removed. Patient tolerated procedure well. IV needle removed intact.

## 2023-11-27 NOTE — Patient Instructions (Signed)

## 2023-12-11 ENCOUNTER — Inpatient Hospital Stay

## 2023-12-11 ENCOUNTER — Encounter: Payer: Self-pay | Admitting: Oncology

## 2023-12-11 ENCOUNTER — Inpatient Hospital Stay (HOSPITAL_BASED_OUTPATIENT_CLINIC_OR_DEPARTMENT_OTHER): Admitting: Oncology

## 2023-12-11 ENCOUNTER — Inpatient Hospital Stay: Attending: Oncology

## 2023-12-11 VITALS — BP 127/57 | HR 59 | Resp 18

## 2023-12-11 VITALS — BP 133/57 | HR 58 | Temp 97.7°F | Resp 18 | Wt 226.7 lb

## 2023-12-11 DIAGNOSIS — D45 Polycythemia vera: Secondary | ICD-10-CM

## 2023-12-11 DIAGNOSIS — D508 Other iron deficiency anemias: Secondary | ICD-10-CM

## 2023-12-11 LAB — CBC WITH DIFFERENTIAL (CANCER CENTER ONLY)
Abs Immature Granulocytes: 0.06 K/uL (ref 0.00–0.07)
Basophils Absolute: 0.1 K/uL (ref 0.0–0.1)
Basophils Relative: 1 %
Eosinophils Absolute: 0.1 K/uL (ref 0.0–0.5)
Eosinophils Relative: 1 %
HCT: 49.1 % (ref 39.0–52.0)
Hemoglobin: 15.8 g/dL (ref 13.0–17.0)
Immature Granulocytes: 1 %
Lymphocytes Relative: 15 %
Lymphs Abs: 1.1 K/uL (ref 0.7–4.0)
MCH: 30.1 pg (ref 26.0–34.0)
MCHC: 32.2 g/dL (ref 30.0–36.0)
MCV: 93.5 fL (ref 80.0–100.0)
Monocytes Absolute: 0.3 K/uL (ref 0.1–1.0)
Monocytes Relative: 4 %
Neutro Abs: 5.8 K/uL (ref 1.7–7.7)
Neutrophils Relative %: 78 %
Platelet Count: 380 K/uL (ref 150–400)
RBC: 5.25 MIL/uL (ref 4.22–5.81)
RDW: 18.9 % — ABNORMAL HIGH (ref 11.5–15.5)
WBC Count: 7.4 K/uL (ref 4.0–10.5)
nRBC: 0 % (ref 0.0–0.2)

## 2023-12-11 LAB — CMP (CANCER CENTER ONLY)
ALT: 24 U/L (ref 0–44)
AST: 26 U/L (ref 15–41)
Albumin: 4.4 g/dL (ref 3.5–5.0)
Alkaline Phosphatase: 65 U/L (ref 38–126)
Anion gap: 10 (ref 5–15)
BUN: 16 mg/dL (ref 8–23)
CO2: 27 mmol/L (ref 22–32)
Calcium: 9.3 mg/dL (ref 8.9–10.3)
Chloride: 100 mmol/L (ref 98–111)
Creatinine: 1.33 mg/dL — ABNORMAL HIGH (ref 0.61–1.24)
GFR, Estimated: 54 mL/min — ABNORMAL LOW (ref >60)
Glucose, Bld: 106 mg/dL — ABNORMAL HIGH (ref 70–99)
Potassium: 4.4 mmol/L (ref 3.5–5.1)
Sodium: 137 mmol/L (ref 135–145)
Total Bilirubin: 1 mg/dL (ref 0.0–1.2)
Total Protein: 7.4 g/dL (ref 6.5–8.1)

## 2023-12-11 MED ORDER — HYDROXYUREA 500 MG PO CAPS: 1000.0000 mg | ORAL_CAPSULE | Freq: Every day | ORAL | 0 refills | Status: DC

## 2023-12-11 NOTE — Assessment & Plan Note (Addendum)
 Jak2 mutated myeloproliferative disease, favor polycythemia vera  Bone marrow biopsy results were reviewed with patient.  Hematocrit is not at goal yet. He tolerated hydroxyurea  1000mg  daily.  Recommend phlebotomy, goal of Hct 45.   Continue aspirin 81 mg daily. Repeat H&H in 3 weeks +/- phlebotomy. Follow-up in 6 weeks.

## 2023-12-11 NOTE — Progress Notes (Signed)
 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  Hematocrit is not at goal yet. He tolerated hydroxyurea  1000mg  daily.  Recommend phlebotomy, goal of Hct 45.   Continue aspirin 81 mg daily. Repeat H&H in 3 weeks +/- phlebotomy. Follow-up in 6 weeks.  Orders Placed This Encounter  Procedures   CMP (Cancer Center only)    Standing Status:   Future    Expected Date:   01/22/2024    Expiration Date:   04/21/2024   CBC with Differential (Cancer Center Only)    Standing Status:   Future    Expected Date:   01/22/2024    Expiration Date:   04/21/2024   Hemoglobin and Hematocrit (Cancer Center Only)    Standing Status:   Future    Expected Date:   01/01/2024    Expiration Date:   03/31/2024    All questions were answered. The patient knows to call the clinic with any problems, questions or concerns.  Zelphia Cap, MD, PhD [facility] Hematology Oncology 12/11/2023     HISTORY OF PRESENTING ILLNESS:  James Schweitzer. is a  83 y.o.  male with PMH listed below who was referred to me for anemia Reviewed patient's recent labs that was done.  He was found to have abnormal CBC on 03/07/2022 cbc showed hb 13, mcv 74, platelet 557 Iron  saturation 11, ferritin 3 Some fatigue.  He denies recent chest pain on exertion, shortness of breath on minimal exertion, pre-syncopal episodes, or palpitations He had not noticed any recent bleeding such as epistaxis, hematuria or hematochezia.  He denies over the counter NSAID ingestion. Cologuard was 5 years ago  he has established care with [facility]  Last colonoscopy, findings include diverticulosis in sigmoid colon, interval hemorrhoids.   10/15/2023 Bone  marrow biopsy.  BONE MARROW, ASPIRATE, CLOT, CORE:  - JAK2 positive myeloproliferative neoplasm, morphologically favor  polycythemia, see note   PERIPHERAL BLOOD:  - Erythrocytosis and thrombocytosis   There are limited spicules for evaluation and the biopsy consists of predominantly of periosteum and cortical bone with limited subcortical marrow.  However, the subcortical marrow is focally hypercellular for age with an overall apparent panmyelosis with megakaryocytic hyperplasia which are hyperlobulated and present in clusters.  This megakaryocytic hyperplasia is also present on the limited aspirate smear slides.  While there is limited material for evaluation the overall presence of an apparent panmyelosis and the range of megakaryocytic morphologies in conjunction with the peripheral blood erythrocytosis and borderline thrombocytosis favors a diagnosis of polycythemia vera.     A reticulin stain shows focal mild reticulin fiber deposition, overall MF 0 to MF 1 out of MF 3, on limited material.     INTERVAL HISTORY James Sjogren. is a 83 y.o. male who has above history reviewed by me today presents for follow up visit for JAK2 positive polycythemia vera. Patient tolerates hydroxyurea  1000 mg daily.  No new complaints.   MEDICAL HISTORY:  Past Medical History:  Diagnosis Date   Arthritis    lower back and both ankles   Chronic kidney disease    Kidney stones   Diabetes mellitus without complication (HCC)    Diverticulosis    GERD (gastroesophageal reflux disease)    Hyperlipidemia  Hypertension     SURGICAL HISTORY: Past Surgical History:  Procedure Laterality Date   COLONOSCOPY WITH PROPOFOL  N/A 05/30/2022   Procedure: COLONOSCOPY WITH PROPOFOL ;  Surgeon: Maryruth Ole DASEN, MD;  Location: [facility] ENDOSCOPY;  Service: Endoscopy;  Laterality: N/A;   ESOPHAGEAL MANOMETRY N/A 11/06/2018   Procedure: ESOPHAGEAL MANOMETRY (EM);  Surgeon: Shila Gustav GAILS, MD;   Location: [facility] ENDOSCOPY;  Service: Endoscopy;  Laterality: N/A;   ESOPHAGOGASTRODUODENOSCOPY N/A 05/30/2022   Procedure: ESOPHAGOGASTRODUODENOSCOPY (EGD);  Surgeon: Maryruth Ole DASEN, MD;  Location: [facility] ENDOSCOPY;  Service: Endoscopy;  Laterality: N/A;   IR BONE MARROW BIOPSY & ASPIRATION  10/15/2023   KIDNEY STONE SURGERY Right 1983   Dr. Patrina   ROBOTIC ASSITED PARTIAL NEPHRECTOMY Left 09/02/2021   Procedure: XI ROBOTIC ASSITED PARTIAL NEPHRECTOM, EXCISION OF PERINEPHRIC FAT;  Surgeon: Selma Donnice SAUNDERS, MD;  Location: [facility] ORS;  Service: Urology;  Laterality: Left;   TONSILLECTOMY     UMBILICAL HERNIA REPAIR N/A 03/11/2015   Procedure: HERNIA REPAIR UMBILICAL ADULT;  Surgeon: Larinda Unknown Sharps, MD;  Location: [facility] ORS;  Service: General;  Laterality: N/A;    SOCIAL HISTORY: Social History   Socioeconomic History   Marital status: Married    Spouse name: Not on file   Number of children: Not on file   Years of education: Not on file   Highest education level: Not on file  Occupational History   Not on file  Tobacco Use   Smoking status: Former    Current packs/day: 0.00    Average packs/day: 1 pack/day for 30.0 years (30.0 ttl pk-yrs)    Types: Cigarettes    Start date: 05/28/1972    Quit date: 05/29/2002    Years since quitting: 21.5   Smokeless tobacco: Never  Vaping Use   Vaping status: Never Used  Substance and Sexual Activity   Alcohol use: Yes    Comment: occasional   Drug use: No   Sexual activity: Not on file  Other Topics Concern   Not on file  Social History Narrative   Not on file   Social Drivers of Health   Financial Resource Strain: Low Risk  (11/08/2023)   Received from [facility]   Overall Financial Resource Strain (CARDIA)    Difficulty of Paying Living Expenses: Not hard at all  Food Insecurity: No Food Insecurity (11/08/2023)   Received from [facility]   Hunger Vital Sign    Within the past 12 months, you worried  that your food would run out before you got the money to buy more.: Never true    Within the past 12 months, the food you bought just didn't last and you didn't have money to get more.: Never true  Transportation Needs: No Transportation Needs (11/08/2023)   Received from [facility] - [facility]    In the past 12 months, has lack of transportation kept you from medical appointments or from getting medications?: No    Lack of Transportation (Non-Medical): No  Physical Activity: Insufficiently Active (03/31/2022)   Exercise Vital Sign    Days of Exercise per Week: 1 day    Minutes of Exercise per Session: 20 min  Stress: No Stress Concern Present (03/31/2022)   [employer] of Occupational Health - Occupational Stress Questionnaire    Feeling of Stress : Not at all  Social Connections: Socially Integrated (03/31/2022)   Social Connection and Isolation Panel    Frequency of Communication with Friends  and Family: Three times a week    Frequency of Social Gatherings with Friends and Family: Once a week    Attends Religious Services: More than 4 times per year    Active Member of [employer] or Organizations: Yes    Attends Banker Meetings: 1 to 4 times per year    Marital Status: Married  Catering manager Violence: Not At Risk (03/31/2022)   Humiliation, Afraid, Rape, and Kick questionnaire    Fear of Current or Ex-Partner: No    Emotionally Abused: No    Physically Abused: No    Sexually Abused: No    FAMILY HISTORY: Family History  Problem Relation Age of Onset   Heart disease Mother    Heart disease Other     ALLERGIES:  has no known allergies.  MEDICATIONS:  Current Outpatient Medications  Medication Sig Dispense Refill   aspirin (ASPIRIN ADULT LOW DOSE) 81 MG EC tablet Take 81 mg by mouth daily. Swallow whole.     atorvastatin  (LIPITOR) 20 MG tablet Take 20 mg by mouth at bedtime.     Cyanocobalamin (B-12 PO) Take 1,000 mcg by mouth  daily.     DiphenhydrAMINE  HCl, Sleep, 50 MG CAPS Take 100 mg by mouth at bedtime.     docusate sodium  (COLACE) 100 MG capsule Take 1 capsule by mouth daily as needed for up to 30 doses. 30 capsule 0   hydrochlorothiazide (HYDRODIURIL) 12.5 MG tablet Take 12.5 mg by mouth daily.     ibuprofen (ADVIL) 200 MG tablet Take 400 mg by mouth every 6 (six) hours as needed for moderate pain.     ipratropium (ATROVENT ) 0.03 % nasal spray Place 2 sprays into both nostrils 3 (three) times daily before meals.     Iron -Vitamin C (VITRON-C) 65-125 MG TABS Take by mouth.     metFORMIN (GLUCOPHAGE-XR) 500 MG 24 hr tablet Take 500 mg by mouth 2 (two) times daily.     metoprolol  succinate (TOPROL -XL) 25 MG 24 hr tablet Take 25 mg by mouth in the morning and at bedtime.     Multiple Vitamins-Minerals (PRESERVISION AREDS 2 PO) Take 1 tablet by mouth in the morning and at bedtime.     Omega-3 Fatty Acids (FISH OIL) 1200 MG CAPS Take 2,400 mg by mouth daily.     omeprazole (PRILOSEC) 20 MG capsule Take 20 mg by mouth at bedtime.     oxyCODONE -acetaminophen  (PERCOCET) 5-325 MG tablet Take 1 tablet by mouth every 4 hours as needed for severe pain. 20 tablet 0   silodosin (RAPAFLO) 8 MG CAPS capsule Take 8 mg by mouth at bedtime.     HYDROcodone -acetaminophen  (NORCO) 5-325 MG tablet Take 1-2 tablets by mouth every 4 (four) hours as needed for moderate pain. (Patient not taking: Reported on 12/11/2023) 12 tablet 0   hydroxyurea  (HYDREA ) 500 MG capsule Take 2 capsules (1,000 mg total) by mouth daily. May take with food to minimize GI side effects. 60 capsule 0   No current facility-administered medications for this visit.    Review of Systems  Constitutional:  Positive for fatigue. Negative for appetite change, chills, fever and unexpected weight change.  HENT:   Negative for hearing loss and voice change.   Eyes:  Negative for eye problems and icterus.  Respiratory:  Negative for chest tightness, cough and shortness of  breath.   Cardiovascular:  Negative for chest pain and leg swelling.  Gastrointestinal:  Negative for abdominal distention and abdominal pain.  Endocrine: Negative  for hot flashes.  Genitourinary:  Negative for difficulty urinating, dysuria and frequency.   Musculoskeletal:  Negative for arthralgias.  Skin:  Negative for itching and rash.  Neurological:  Negative for light-headedness and numbness.  Hematological:  Negative for adenopathy. Does not bruise/bleed easily.  Psychiatric/Behavioral:  Negative for confusion.     PHYSICAL EXAMINATION: ECOG PERFORMANCE STATUS: 0 - Asymptomatic Vitals:   12/11/23 1424  BP: (!) 133/57  Pulse: (!) 58  Resp: 18  Temp: 97.7 F (36.5 C)  SpO2: 96%   Filed Weights   12/11/23 1424  Weight: 226 lb 11.2 oz (102.8 kg)    Physical Exam Constitutional:      General: He is not in acute distress. HENT:     Head: Normocephalic and atraumatic.  Eyes:     General: No scleral icterus. Cardiovascular:     Rate and Rhythm: Normal rate.  Pulmonary:     Effort: Pulmonary effort is normal. No respiratory distress.  Abdominal:     General: There is no distension.  Musculoskeletal:     Cervical back: Normal range of motion and neck supple.  Skin:    Findings: No rash.  Neurological:     Mental Status: He is alert and oriented to person, place, and time. Mental status is at baseline.  Psychiatric:        Mood and Affect: Mood normal.      LABORATORY DATA:  I have reviewed the data as listed    Latest Ref Rng & Units 12/11/2023    2:09 PM 11/27/2023    1:54 PM 11/20/2023    1:53 PM  CBC  WBC 4.0 - 10.5 K/uL 7.4   8.1   Hemoglobin 13.0 - 17.0 g/dL 84.1  84.9  82.7   Hematocrit 39.0 - 52.0 % 49.1  46.3  53.4   Platelets 150 - 400 K/uL 380   409       Latest Ref Rng & Units 12/11/2023    2:09 PM 11/20/2023    1:53 PM 09/01/2022   12:55 PM  CMP  Glucose 70 - 99 mg/dL 893  893  879   BUN 8 - 23 mg/dL 16  18  16    Creatinine 0.61 - 1.24  mg/dL 8.66  8.76  8.86   Sodium 135 - 145 mmol/L 137  136  137   Potassium 3.5 - 5.1 mmol/L 4.4  4.4  4.3   Chloride 98 - 111 mmol/L 100  102  105   CO2 22 - 32 mmol/L 27  26  24    Calcium  8.9 - 10.3 mg/dL 9.3  9.0  9.1   Total Protein 6.5 - 8.1 g/dL 7.4  6.8  6.7   Total Bilirubin 0.0 - 1.2 mg/dL 1.0  1.1  0.6   Alkaline Phos 38 - 126 U/L 65  59  65   AST 15 - 41 U/L 26  28  28    ALT 0 - 44 U/L 24  26  22        Component Value Date/Time   IRON  102 09/10/2023 0932   TIBC 343 09/10/2023 0932   FERRITIN 39 09/10/2023 0932   IRONPCTSAT 30 09/10/2023 0932     RADIOGRAPHIC STUDIES: I have personally reviewed the radiological images as listed and agreed with the findings in the report. No results found.

## 2024-01-01 ENCOUNTER — Inpatient Hospital Stay

## 2024-01-02 ENCOUNTER — Inpatient Hospital Stay

## 2024-01-02 ENCOUNTER — Inpatient Hospital Stay: Attending: Oncology

## 2024-01-02 DIAGNOSIS — D45 Polycythemia vera: Secondary | ICD-10-CM | POA: Diagnosis present

## 2024-01-02 LAB — HEMOGLOBIN AND HEMATOCRIT (CANCER CENTER ONLY)
HCT: 43.2 % (ref 39.0–52.0)
Hemoglobin: 13.8 g/dL (ref 13.0–17.0)

## 2024-01-02 NOTE — Progress Notes (Signed)
 Hold phlebotomy

## 2024-01-28 ENCOUNTER — Inpatient Hospital Stay: Admitting: Oncology

## 2024-01-28 ENCOUNTER — Encounter: Payer: Self-pay | Admitting: Oncology

## 2024-01-28 ENCOUNTER — Inpatient Hospital Stay: Attending: Oncology

## 2024-01-28 ENCOUNTER — Inpatient Hospital Stay

## 2024-01-28 VITALS — BP 137/66 | HR 60 | Temp 97.8°F | Resp 17 | Wt 225.5 lb

## 2024-01-28 DIAGNOSIS — E785 Hyperlipidemia, unspecified: Secondary | ICD-10-CM | POA: Insufficient documentation

## 2024-01-28 DIAGNOSIS — Z8719 Personal history of other diseases of the digestive system: Secondary | ICD-10-CM | POA: Insufficient documentation

## 2024-01-28 DIAGNOSIS — D45 Polycythemia vera: Secondary | ICD-10-CM | POA: Insufficient documentation

## 2024-01-28 DIAGNOSIS — K649 Unspecified hemorrhoids: Secondary | ICD-10-CM | POA: Diagnosis not present

## 2024-01-28 DIAGNOSIS — Z79899 Other long term (current) drug therapy: Secondary | ICD-10-CM | POA: Diagnosis not present

## 2024-01-28 DIAGNOSIS — Z87891 Personal history of nicotine dependence: Secondary | ICD-10-CM | POA: Diagnosis not present

## 2024-01-28 DIAGNOSIS — Z87442 Personal history of urinary calculi: Secondary | ICD-10-CM | POA: Insufficient documentation

## 2024-01-28 DIAGNOSIS — E119 Type 2 diabetes mellitus without complications: Secondary | ICD-10-CM | POA: Diagnosis not present

## 2024-01-28 DIAGNOSIS — D509 Iron deficiency anemia, unspecified: Secondary | ICD-10-CM | POA: Diagnosis not present

## 2024-01-28 DIAGNOSIS — I10 Essential (primary) hypertension: Secondary | ICD-10-CM | POA: Insufficient documentation

## 2024-01-28 DIAGNOSIS — Z7982 Long term (current) use of aspirin: Secondary | ICD-10-CM | POA: Insufficient documentation

## 2024-01-28 DIAGNOSIS — K219 Gastro-esophageal reflux disease without esophagitis: Secondary | ICD-10-CM | POA: Diagnosis not present

## 2024-01-28 DIAGNOSIS — K5730 Diverticulosis of large intestine without perforation or abscess without bleeding: Secondary | ICD-10-CM | POA: Diagnosis not present

## 2024-01-28 DIAGNOSIS — Z7984 Long term (current) use of oral hypoglycemic drugs: Secondary | ICD-10-CM | POA: Diagnosis not present

## 2024-01-28 DIAGNOSIS — D508 Other iron deficiency anemias: Secondary | ICD-10-CM

## 2024-01-28 LAB — CBC WITH DIFFERENTIAL (CANCER CENTER ONLY)
Abs Immature Granulocytes: 0.04 K/uL (ref 0.00–0.07)
Basophils Absolute: 0.1 K/uL (ref 0.0–0.1)
Basophils Relative: 1 %
Eosinophils Absolute: 0.1 K/uL (ref 0.0–0.5)
Eosinophils Relative: 2 %
HCT: 45.3 % (ref 39.0–52.0)
Hemoglobin: 14.8 g/dL (ref 13.0–17.0)
Immature Granulocytes: 1 %
Lymphocytes Relative: 17 %
Lymphs Abs: 1.2 K/uL (ref 0.7–4.0)
MCH: 31.5 pg (ref 26.0–34.0)
MCHC: 32.7 g/dL (ref 30.0–36.0)
MCV: 96.4 fL (ref 80.0–100.0)
Monocytes Absolute: 0.4 K/uL (ref 0.1–1.0)
Monocytes Relative: 6 %
Neutro Abs: 5.3 K/uL (ref 1.7–7.7)
Neutrophils Relative %: 73 %
Platelet Count: 298 K/uL (ref 150–400)
RBC: 4.7 MIL/uL (ref 4.22–5.81)
RDW: 16.8 % — ABNORMAL HIGH (ref 11.5–15.5)
WBC Count: 7.2 K/uL (ref 4.0–10.5)
nRBC: 0 % (ref 0.0–0.2)

## 2024-01-28 LAB — CMP (CANCER CENTER ONLY)
ALT: 18 U/L (ref 0–44)
AST: 23 U/L (ref 15–41)
Albumin: 3.9 g/dL (ref 3.5–5.0)
Alkaline Phosphatase: 67 U/L (ref 38–126)
Anion gap: 10 (ref 5–15)
BUN: 19 mg/dL (ref 8–23)
CO2: 23 mmol/L (ref 22–32)
Calcium: 9.1 mg/dL (ref 8.9–10.3)
Chloride: 105 mmol/L (ref 98–111)
Creatinine: 1.03 mg/dL (ref 0.61–1.24)
GFR, Estimated: >60 mL/min (ref >60)
Glucose, Bld: 87 mg/dL (ref 70–99)
Potassium: 4.5 mmol/L (ref 3.5–5.1)
Sodium: 138 mmol/L (ref 135–145)
Total Bilirubin: 0.7 mg/dL (ref 0.0–1.2)
Total Protein: 6.9 g/dL (ref 6.5–8.1)

## 2024-01-28 MED ORDER — HYDROXYUREA 500 MG PO CAPS: ORAL_CAPSULE | ORAL | 2 refills | Status: AC

## 2024-01-28 NOTE — Assessment & Plan Note (Signed)
 Hold off iron  supplementation in the context of PV.

## 2024-01-28 NOTE — Progress Notes (Signed)
 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  . Recommend hydroxyurea  1500 mg on Wednesdays, Take 1000mg  daily for rest of the week. goal of Hct 45.  Today's hemoglobin and hematocrit is close to target.  Hold off phlebotomy. Continue aspirin 81 mg daily.   IDA (iron  deficiency anemia) Hold off iron  supplementation in the context of PV.   Orders Placed This Encounter  Procedures   CBC with Differential (Cancer Center Only)    Standing Status:   Future    Expected Date:   04/27/2024    Expiration Date:   07/26/2024   CMP (Cancer Center only)    Standing Status:   Future    Expected Date:   04/27/2024    Expiration Date:   07/26/2024   CBC (Cancer Center Only)    Standing Status:   Future    Expected Date:   03/10/2024    Expiration Date:   06/08/2024   Repeat CBC in 6 weeks, follow-up in 3 months All questions were answered. The patient knows to call the clinic with any problems, questions or concerns.  Zelphia Cap, MD, PhD [facility] Hematology Oncology 01/28/2024     HISTORY OF PRESENTING ILLNESS:  James Heidrich. is a  83 y.o.  male with PMH listed below who was referred to me for anemia Reviewed patient's recent labs that was done.  He was found to have abnormal CBC on 03/07/2022 cbc showed hb 13, mcv 74, platelet 557 Iron  saturation 11, ferritin 3 Some fatigue.  He denies recent chest pain on exertion, shortness of breath on minimal exertion, pre-syncopal episodes, or palpitations He had not noticed any recent bleeding such as epistaxis, hematuria or hematochezia.  He denies over the counter NSAID ingestion. Cologuard was 5 years ago  he has established care with [facility]  Last colonoscopy, findings include diverticulosis in sigmoid colon, interval hemorrhoids.   10/15/2023 Bone marrow biopsy.  BONE MARROW, ASPIRATE, CLOT, CORE:  - JAK2 positive myeloproliferative neoplasm, morphologically favor  polycythemia, see note   PERIPHERAL BLOOD:  - Erythrocytosis and thrombocytosis   There are limited spicules for evaluation and the biopsy consists of predominantly of periosteum and cortical bone with limited subcortical marrow.  However, the subcortical marrow is focally hypercellular for age with an overall apparent panmyelosis with megakaryocytic hyperplasia which are hyperlobulated and present in clusters.  This megakaryocytic hyperplasia is also present on the limited aspirate smear slides.  While there is limited material for evaluation the overall presence of an apparent panmyelosis and the range of megakaryocytic morphologies in conjunction with the peripheral blood erythrocytosis and borderline thrombocytosis favors a diagnosis of polycythemia vera.     A reticulin stain shows focal mild reticulin fiber deposition, overall MF 0 to MF 1 out of MF 3, on limited material.     INTERVAL HISTORY James Yang. is a 83 y.o. male who has above history reviewed by me today presents for follow up visit for JAK2 positive polycythemia vera. Patient tolerates hydroxyurea  1000 mg daily.  No new complaints.   MEDICAL HISTORY:  Past Medical History:  Diagnosis Date   Arthritis    lower back and both ankles   Chronic kidney disease    Kidney stones  Diabetes mellitus without complication (HCC)    Diverticulosis    GERD (gastroesophageal reflux disease)    Hyperlipidemia    Hypertension     SURGICAL HISTORY: Past Surgical History:  Procedure Laterality Date   COLONOSCOPY WITH PROPOFOL  N/A 05/30/2022   Procedure: COLONOSCOPY WITH PROPOFOL ;  Surgeon: Maryruth Ole DASEN, MD;  Location: [facility] ENDOSCOPY;  Service: Endoscopy;  Laterality: N/A;    ESOPHAGEAL MANOMETRY N/A 11/06/2018   Procedure: ESOPHAGEAL MANOMETRY (EM);  Surgeon: Shila Gustav GAILS, MD;  Location: [facility] ENDOSCOPY;  Service: Endoscopy;  Laterality: N/A;   ESOPHAGOGASTRODUODENOSCOPY N/A 05/30/2022   Procedure: ESOPHAGOGASTRODUODENOSCOPY (EGD);  Surgeon: Maryruth Ole DASEN, MD;  Location: [facility] ENDOSCOPY;  Service: Endoscopy;  Laterality: N/A;   IR BONE MARROW BIOPSY & ASPIRATION  10/15/2023   KIDNEY STONE SURGERY Right 1983   Dr. Patrina   ROBOTIC ASSITED PARTIAL NEPHRECTOMY Left 09/02/2021   Procedure: XI ROBOTIC ASSITED PARTIAL NEPHRECTOM, EXCISION OF PERINEPHRIC FAT;  Surgeon: Selma Donnice SAUNDERS, MD;  Location: [facility] ORS;  Service: Urology;  Laterality: Left;   TONSILLECTOMY     UMBILICAL HERNIA REPAIR N/A 03/11/2015   Procedure: HERNIA REPAIR UMBILICAL ADULT;  Surgeon: Larinda Unknown Sharps, MD;  Location: [facility] ORS;  Service: General;  Laterality: N/A;    SOCIAL HISTORY: Social History   Socioeconomic History   Marital status: Married    Spouse name: Not on file   Number of children: Not on file   Years of education: Not on file   Highest education level: Not on file  Occupational History   Not on file  Tobacco Use   Smoking status: Former    Current packs/day: 0.00    Average packs/day: 1 pack/day for 30.0 years (30.0 ttl pk-yrs)    Types: Cigarettes    Start date: 05/28/1972    Quit date: 05/29/2002    Years since quitting: 21.6   Smokeless tobacco: Never  Vaping Use   Vaping status: Never Used  Substance and Sexual Activity   Alcohol use: Yes    Comment: occasional   Drug use: No   Sexual activity: Not on file  Other Topics Concern   Not on file  Social History Narrative   Not on file   Social Drivers of Health   Financial Resource Strain: Low Risk  (11/08/2023)   Received from [facility]   Overall Financial Resource Strain (CARDIA)    Difficulty of Paying Living Expenses: Not hard at all  Food Insecurity: No Food Insecurity (11/08/2023)    Received from [facility]   Hunger Vital Sign    Within the past 12 months, you worried that your food would run out before you got the money to buy more.: Never true    Within the past 12 months, the food you bought just didn't last and you didn't have money to get more.: Never true  Transportation Needs: No Transportation Needs (11/08/2023)   Received from [facility] - Transportation    In the past 12 months, has lack of transportation kept you from medical appointments or from getting medications?: No    Lack of Transportation (Non-Medical): No  Physical Activity: Insufficiently Active (03/31/2022)   Exercise Vital Sign    Days of Exercise per Week: 1 day    Minutes of Exercise per Session: 20 min  Stress: No Stress Concern Present (03/31/2022)   [employer] of Occupational [employer] - Occupational Stress Questionnaire    Feeling of Stress : Not  at all  Social Connections: Socially Integrated (03/31/2022)   Social Connection and Isolation Panel    Frequency of Communication with Friends and Family: Three times a week    Frequency of Social Gatherings with Friends and Family: Once a week    Attends Religious Services: More than 4 times per year    Active Member of [employer] or Organizations: Yes    Attends Banker Meetings: 1 to 4 times per year    Marital Status: Married  Catering Manager Violence: Not At Risk (03/31/2022)   Humiliation, Afraid, Rape, and Kick questionnaire    Fear of Current or Ex-Partner: No    Emotionally Abused: No    Physically Abused: No    Sexually Abused: No    FAMILY HISTORY: Family History  Problem Relation Age of Onset   Heart disease Mother    Heart disease Other     ALLERGIES:  has no known allergies.  MEDICATIONS:  Current Outpatient Medications  Medication Sig Dispense Refill   aspirin (ASPIRIN ADULT LOW DOSE) 81 MG EC tablet Take 81 mg by mouth daily. Swallow whole.     atorvastatin   (LIPITOR) 20 MG tablet Take 20 mg by mouth at bedtime.     Cyanocobalamin (B-12 PO) Take 1,000 mcg by mouth daily.     DiphenhydrAMINE  HCl, Sleep, 50 MG CAPS Take 100 mg by mouth at bedtime.     docusate sodium  (COLACE) 100 MG capsule Take 1 capsule by mouth daily as needed for up to 30 doses. 30 capsule 0   hydrochlorothiazide (HYDRODIURIL) 12.5 MG tablet Take 12.5 mg by mouth daily.     ibuprofen (ADVIL) 200 MG tablet Take 400 mg by mouth every 6 (six) hours as needed for moderate pain.     ipratropium (ATROVENT ) 0.03 % nasal spray Place 2 sprays into both nostrils 3 (three) times daily before meals.     Iron -Vitamin C (VITRON-C) 65-125 MG TABS Take by mouth.     metFORMIN (GLUCOPHAGE-XR) 500 MG 24 hr tablet Take 500 mg by mouth 2 (two) times daily.     metoprolol  succinate (TOPROL -XL) 25 MG 24 hr tablet Take 25 mg by mouth in the morning and at bedtime.     Multiple Vitamins-Minerals (PRESERVISION AREDS 2 PO) Take 1 tablet by mouth in the morning and at bedtime.     Omega-3 Fatty Acids (FISH OIL) 1200 MG CAPS Take 2,400 mg by mouth daily.     omeprazole (PRILOSEC) 20 MG capsule Take 20 mg by mouth at bedtime.     oxyCODONE -acetaminophen  (PERCOCET) 5-325 MG tablet Take 1 tablet by mouth every 4 hours as needed for severe pain. 20 tablet 0   silodosin (RAPAFLO) 8 MG CAPS capsule Take 8 mg by mouth at bedtime.     HYDROcodone -acetaminophen  (NORCO) 5-325 MG tablet Take 1-2 tablets by mouth every 4 (four) hours as needed for moderate pain. (Patient not taking: Reported on 01/28/2024) 12 tablet 0   hydroxyurea  (HYDREA ) 500 MG capsule May take with food to minimize GI side effects.Take 1500mg  on Wednesdays, and 1000mg  daily for the rest of the week. 64 capsule 2   No current facility-administered medications for this visit.    Review of Systems  Constitutional:  Positive for fatigue. Negative for appetite change, chills, fever and unexpected weight change.  HENT:   Negative for hearing loss and  voice change.   Eyes:  Negative for eye problems and icterus.  Respiratory:  Negative for chest tightness, cough and shortness  of breath.   Cardiovascular:  Negative for chest pain and leg swelling.  Gastrointestinal:  Negative for abdominal distention and abdominal pain.  Endocrine: Negative for hot flashes.  Genitourinary:  Negative for difficulty urinating, dysuria and frequency.   Musculoskeletal:  Negative for arthralgias.  Skin:  Negative for itching and rash.  Neurological:  Negative for light-headedness and numbness.  Hematological:  Negative for adenopathy. Does not bruise/bleed easily.  Psychiatric/Behavioral:  Negative for confusion.     PHYSICAL EXAMINATION: ECOG PERFORMANCE STATUS: 0 - Asymptomatic Vitals:   01/28/24 1402  BP: 137/66  Pulse: 60  Resp: 17  Temp: 97.8 F (36.6 C)  SpO2: 96%   Filed Weights   01/28/24 1402  Weight: 225 lb 8 oz (102.3 kg)    Physical Exam Constitutional:      General: He is not in acute distress. HENT:     Head: Normocephalic and atraumatic.  Eyes:     General: No scleral icterus. Cardiovascular:     Rate and Rhythm: Normal rate.  Pulmonary:     Effort: Pulmonary effort is normal. No respiratory distress.  Abdominal:     General: There is no distension.  Musculoskeletal:     Cervical back: Normal range of motion and neck supple.  Skin:    Findings: No rash.  Neurological:     Mental Status: He is alert and oriented to person, place, and time. Mental status is at baseline.  Psychiatric:        Mood and Affect: Mood normal.      LABORATORY DATA:  I have reviewed the data as listed    Latest Ref Rng & Units 01/28/2024    1:29 PM 01/02/2024    2:48 PM 12/11/2023    2:09 PM  CBC  WBC 4.0 - 10.5 K/uL 7.2   7.4   Hemoglobin 13.0 - 17.0 g/dL 85.1  86.1  84.1   Hematocrit 39.0 - 52.0 % 45.3  43.2  49.1   Platelets 150 - 400 K/uL 298   380       Latest Ref Rng & Units 01/28/2024    1:29 PM 12/11/2023    2:09 PM  11/20/2023    1:53 PM  CMP  Glucose 70 - 99 mg/dL 87  893  893   BUN 8 - 23 mg/dL 19  16  18    Creatinine 0.61 - 1.24 mg/dL 8.96  8.66  8.76   Sodium 135 - 145 mmol/L 138  137  136   Potassium 3.5 - 5.1 mmol/L 4.5  4.4  4.4   Chloride 98 - 111 mmol/L 105  100  102   CO2 22 - 32 mmol/L 23  27  26    Calcium  8.9 - 10.3 mg/dL 9.1  9.3  9.0   Total Protein 6.5 - 8.1 g/dL 6.9  7.4  6.8   Total Bilirubin 0.0 - 1.2 mg/dL 0.7  1.0  1.1   Alkaline Phos 38 - 126 U/L 67  65  59   AST 15 - 41 U/L 23  26  28    ALT 0 - 44 U/L 18  24  26        Component Value Date/Time   IRON  102 09/10/2023 0932   TIBC 343 09/10/2023 0932   FERRITIN 39 09/10/2023 0932   IRONPCTSAT 30 09/10/2023 0932     RADIOGRAPHIC STUDIES: I have personally reviewed the radiological images as listed and agreed with the findings in the report. No results found.

## 2024-01-28 NOTE — Assessment & Plan Note (Addendum)
 Jak2 mutated myeloproliferative disease, favor polycythemia vera  Bone marrow biopsy results were reviewed with patient.  . Recommend hydroxyurea  1500 mg on Wednesdays, Take 1000mg  daily for rest of the week. goal of Hct 45.  Today's hemoglobin and hematocrit is close to target.  Hold off phlebotomy. Continue aspirin 81 mg daily.

## 2024-01-28 NOTE — Progress Notes (Signed)
No phlebotomy today.

## 2024-02-04 ENCOUNTER — Other Ambulatory Visit: Payer: Self-pay | Admitting: Oncology

## 2024-03-10 ENCOUNTER — Inpatient Hospital Stay: Attending: Oncology

## 2024-03-10 DIAGNOSIS — D45 Polycythemia vera: Secondary | ICD-10-CM

## 2024-03-10 LAB — CBC (CANCER CENTER ONLY)
HCT: 47.3 % (ref 39.0–52.0)
Hemoglobin: 15.3 g/dL (ref 13.0–17.0)
MCH: 31.4 pg (ref 26.0–34.0)
MCHC: 32.3 g/dL (ref 30.0–36.0)
MCV: 97.1 fL (ref 80.0–100.0)
Platelet Count: 264 K/uL (ref 150–400)
RBC: 4.87 MIL/uL (ref 4.22–5.81)
RDW: 15.2 % (ref 11.5–15.5)
WBC Count: 7.2 K/uL (ref 4.0–10.5)
nRBC: 0 % (ref 0.0–0.2)

## 2024-04-28 ENCOUNTER — Inpatient Hospital Stay

## 2024-04-28 ENCOUNTER — Inpatient Hospital Stay: Admitting: Oncology
